# Patient Record
Sex: Male | Born: 1960 | Race: White | Hispanic: No
Health system: Midwestern US, Community
[De-identification: ages and names within clinical notes are randomized; demographics above are authoritative.]

## PROBLEM LIST (undated history)

## (undated) DIAGNOSIS — K7581 Nonalcoholic steatohepatitis (NASH): Secondary | ICD-10-CM

## (undated) DIAGNOSIS — E669 Obesity, unspecified: Secondary | ICD-10-CM

## (undated) DIAGNOSIS — K7460 Unspecified cirrhosis of liver: Secondary | ICD-10-CM

## (undated) DIAGNOSIS — K760 Fatty (change of) liver, not elsewhere classified: Secondary | ICD-10-CM

## (undated) DIAGNOSIS — I8510 Secondary esophageal varices without bleeding: Secondary | ICD-10-CM

## (undated) DIAGNOSIS — K766 Portal hypertension: Secondary | ICD-10-CM

## (undated) DIAGNOSIS — K3189 Other diseases of stomach and duodenum: Secondary | ICD-10-CM

## (undated) DIAGNOSIS — I872 Venous insufficiency (chronic) (peripheral): Secondary | ICD-10-CM

## (undated) DIAGNOSIS — D696 Thrombocytopenia, unspecified: Secondary | ICD-10-CM

## (undated) DIAGNOSIS — R030 Elevated blood-pressure reading, without diagnosis of hypertension: Secondary | ICD-10-CM

## (undated) DIAGNOSIS — G4730 Sleep apnea, unspecified: Secondary | ICD-10-CM

## (undated) DIAGNOSIS — J302 Other seasonal allergic rhinitis: Secondary | ICD-10-CM

## (undated) DIAGNOSIS — E785 Hyperlipidemia, unspecified: Secondary | ICD-10-CM

## (undated) DIAGNOSIS — R29810 Facial weakness: Secondary | ICD-10-CM

## (undated) DIAGNOSIS — K219 Gastro-esophageal reflux disease without esophagitis: Secondary | ICD-10-CM

## (undated) HISTORY — DX: Hyperlipidemia, unspecified: E78.5

## (undated) HISTORY — PX: OTHER SURGICAL HISTORY: SHX169

## (undated) HISTORY — DX: Thrombocytopenia, unspecified: D69.6

## (undated) HISTORY — DX: Venous insufficiency (chronic) (peripheral): I87.2

## (undated) HISTORY — DX: Elevated blood-pressure reading, without diagnosis of hypertension: R03.0

## (undated) HISTORY — DX: Other diseases of stomach and duodenum: K31.89

## (undated) HISTORY — DX: Facial weakness: R29.810

## (undated) HISTORY — PX: UMBILICAL HERNIA REPAIR: SHX196

## (undated) HISTORY — PX: HERNIA REPAIR: SHX51

## (undated) HISTORY — DX: Secondary esophageal varices without bleeding: I85.10

## (undated) HISTORY — DX: Nonalcoholic steatohepatitis (NASH): K75.81

## (undated) HISTORY — DX: Portal hypertension: K76.6

## (undated) HISTORY — DX: Secondary esophageal varices without bleeding: K74.60

## (undated) HISTORY — PX: ESOPHAGEAL VARICE LIGATION: SHX625

## (undated) HISTORY — DX: Obesity, unspecified: E66.9

## (undated) SURGERY — ESOPHAGOSCOPY, WITH ESOPHAGEAL VARICES BAND LIGATION
Anesthesia: Moderate Sedation

---

## 1984-03-22 DIAGNOSIS — R29810 Facial weakness: Secondary | ICD-10-CM

## 1984-03-22 HISTORY — PX: OTHER SURGICAL HISTORY: SHX169

## 1984-03-22 HISTORY — DX: Facial weakness: R29.810

## 1986-03-22 HISTORY — PX: VASECTOMY: SHX75

## 2001-06-17 ENCOUNTER — Emergency Department (HOSPITAL_COMMUNITY): Admission: EM | Admit: 2001-06-17 | Discharge: 2001-06-17 | Payer: Self-pay | Admitting: Emergency Medicine

## 2003-10-31 ENCOUNTER — Encounter: Admission: RE | Admit: 2003-10-31 | Discharge: 2003-11-26 | Payer: Self-pay | Admitting: Family Medicine

## 2010-03-18 ENCOUNTER — Ambulatory Visit
Admission: RE | Admit: 2010-03-18 | Discharge: 2010-03-18 | Payer: Self-pay | Source: Home / Self Care | Attending: Family Medicine | Admitting: Family Medicine

## 2010-03-18 DIAGNOSIS — E785 Hyperlipidemia, unspecified: Secondary | ICD-10-CM | POA: Insufficient documentation

## 2010-03-18 DIAGNOSIS — D696 Thrombocytopenia, unspecified: Secondary | ICD-10-CM | POA: Insufficient documentation

## 2010-03-18 DIAGNOSIS — R030 Elevated blood-pressure reading, without diagnosis of hypertension: Secondary | ICD-10-CM

## 2010-03-18 DIAGNOSIS — E1165 Type 2 diabetes mellitus with hyperglycemia: Secondary | ICD-10-CM

## 2010-03-18 DIAGNOSIS — I10 Essential (primary) hypertension: Secondary | ICD-10-CM

## 2010-03-18 HISTORY — DX: Elevated blood-pressure reading, without diagnosis of hypertension: R03.0

## 2010-03-18 LAB — CONVERTED CEMR LAB
ALT: 68 units/L — ABNORMAL HIGH (ref 0–53)
AST: 56 units/L — ABNORMAL HIGH (ref 0–37)
Albumin: 3.8 g/dL (ref 3.5–5.2)
Alkaline Phosphatase: 64 units/L (ref 39–117)
Basophils Absolute: 0 10*3/uL (ref 0.0–0.1)
Basophils Relative: 0.4 % (ref 0.0–3.0)
Calcium: 8.8 mg/dL (ref 8.4–10.5)
Eosinophils Relative: 3.3 % (ref 0.0–5.0)
GFR calc non Af Amer: 115.24 mL/min (ref >60.00)
HCT: 46.6 % (ref 39.0–52.0)
HDL: 38.2 mg/dL — ABNORMAL LOW (ref >39.00)
Hemoglobin: 16.1 g/dL (ref 13.0–17.0)
Lymphocytes Relative: 22.6 % (ref 12.0–46.0)
Monocytes Relative: 9.5 % (ref 3.0–12.0)
Neutro Abs: 2.7 10*3/uL (ref 1.4–7.7)
Potassium: 3.8 meq/L (ref 3.5–5.1)
RBC: 4.71 M/uL (ref 4.22–5.81)
Sodium: 137 meq/L (ref 135–145)
Total CHOL/HDL Ratio: 5
VLDL: 20 mg/dL (ref 0.0–40.0)
WBC: 4.3 10*3/uL — ABNORMAL LOW (ref 4.5–10.5)

## 2010-03-19 ENCOUNTER — Telehealth: Payer: Self-pay | Admitting: Family Medicine

## 2010-03-19 ENCOUNTER — Telehealth (INDEPENDENT_AMBULATORY_CARE_PROVIDER_SITE_OTHER): Payer: Self-pay | Admitting: *Deleted

## 2010-03-19 ENCOUNTER — Encounter: Payer: Self-pay | Admitting: Family Medicine

## 2010-03-19 DIAGNOSIS — R740 Nonspecific elevation of levels of transaminase and lactic acid dehydrogenase [LDH]: Secondary | ICD-10-CM

## 2010-03-19 DIAGNOSIS — R17 Unspecified jaundice: Secondary | ICD-10-CM | POA: Insufficient documentation

## 2010-03-19 LAB — CONVERTED CEMR LAB: LDH: 240 units/L (ref 94–250)

## 2010-03-20 ENCOUNTER — Encounter: Payer: Self-pay | Admitting: Family Medicine

## 2010-03-22 DIAGNOSIS — K7581 Nonalcoholic steatohepatitis (NASH): Secondary | ICD-10-CM

## 2010-03-22 DIAGNOSIS — D696 Thrombocytopenia, unspecified: Secondary | ICD-10-CM

## 2010-03-22 DIAGNOSIS — E785 Hyperlipidemia, unspecified: Secondary | ICD-10-CM

## 2010-03-22 HISTORY — DX: Nonalcoholic steatohepatitis (NASH): K75.81

## 2010-03-22 HISTORY — DX: Hyperlipidemia, unspecified: E78.5

## 2010-03-22 HISTORY — DX: Thrombocytopenia, unspecified: D69.6

## 2010-04-20 ENCOUNTER — Ambulatory Visit
Admission: RE | Admit: 2010-04-20 | Discharge: 2010-04-20 | Payer: Self-pay | Source: Home / Self Care | Attending: Family Medicine | Admitting: Family Medicine

## 2010-04-20 ENCOUNTER — Encounter
Admission: RE | Admit: 2010-04-20 | Discharge: 2010-04-20 | Payer: Self-pay | Source: Home / Self Care | Attending: Family Medicine | Admitting: Family Medicine

## 2010-04-20 ENCOUNTER — Other Ambulatory Visit: Payer: Self-pay | Admitting: Family Medicine

## 2010-04-20 ENCOUNTER — Encounter: Payer: Self-pay | Admitting: Family Medicine

## 2010-04-20 DIAGNOSIS — K7689 Other specified diseases of liver: Secondary | ICD-10-CM | POA: Insufficient documentation

## 2010-04-20 DIAGNOSIS — L909 Atrophic disorder of skin, unspecified: Secondary | ICD-10-CM | POA: Insufficient documentation

## 2010-04-20 DIAGNOSIS — L919 Hypertrophic disorder of the skin, unspecified: Secondary | ICD-10-CM

## 2010-04-21 ENCOUNTER — Telehealth: Payer: Self-pay | Admitting: Family Medicine

## 2010-04-23 NOTE — Miscellaneous (Signed)
  Clinical Lists Changes  Medications: Added new medication of WELCHOL 625 MG TABS (COLESEVELAM HCL) 3 tabs two times a day - Signed Rx of WELCHOL 625 MG TABS (COLESEVELAM HCL) 3 tabs two times a day;  #180 x 1;  Signed;  Entered by: Kelle Darting M.D.;  Authorized by: Kelle Darting M.D.;  Method used: Printed then faxed to [facility] [phone]*, [address], [place], [place], [place]  [address], Ph: [phone] or [phone], Fax: [phone]    Prescriptions: WELCHOL 625 MG TABS (COLESEVELAM HCL) 3 tabs two times a day  #180 x 1   Entered and Authorized by:   Kelle Darting M.D.   Signed by:   Kelle Darting M.D. on 03/20/2010   Method used:   Printed then faxed to ...       [phone][facility] (retail)       [place], [place]  [address]       Ph: [phone] or [phone]       Fax: [phone]   RxID:   [id number]
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
## 2010-04-23 NOTE — Progress Notes (Signed)
Summary: Bloodwork results  Phone Note Call from Patient Call back at Work Phone [phone] Call back at [phone]   Caller: Patient Summary of Call: Pt is requesting is copy of bloodwork results to be mailed to his home address Initial call taken by: Titus Dubin,  March 19, 2010 12:05 PM  Follow-up for Phone Call        All labs finalled at this time.  Copies printed and mailed to pt. Follow-up by: Abelino Derrick CMA Deborra Medina),  March 20, 2010 10:41 AM

## 2010-04-23 NOTE — Progress Notes (Signed)
Summary: lab results  Phone Note Other Incoming   Summary of Call: Please notify: his labs showed that his bad cholesterol is a bit too high (116, and his goal is <100), and his good cholesterol was a bit low.   I'll start him on a med for cholesterol treatment, plus he needs to work on a diabetic diet--with extra focus on low fat/low cholesterol as well (will print out rx for welchol to fax to pharmacy of his choice--ask him for this info). His HbA1c was 7.4%, which is not too bad but his goal is 6.5% or less.  He was started on metformin yesterday for this, plus needs to get with nutritionist about diabetic diet (he is looking into this through his employer).  Additionally, his liver tests were slightly up and his platelets were a bit low.  If he can get me any old records about his previous liver work-up (in the 1980s?) that would be helpful, but I also recommend that he get an abdominal ultrasound to further assess his liver, pancreas, and spleen.  We'll have to coordinate this testing with a visit back to [place], b/c he is living in [place], [place] currently.  He should be coming in for f/u with me in a month, so we should try to schedule it for that time, and we'll repeat his liver tests and platelets (and others) at that time.  Thanks. Initial call taken by: Kelle Darting M.D.,  March 19, 2010 4:03 PM  Follow-up for Phone Call        [facility] from pt.  He is agreeable with all of the above.  ROA scheudled for 04/20/10.  Ultrasound appt also on 04/20/10.  RX mailed to pt at [address]. [place], [place] [address].  Orders entered into EMR.  Pt has spoken to nutritionist through work adn has a follow up with her.  pt will continue metformin. Follow-up by: Abelino Derrick CMA Deborra Medina),  March 25, 2010 4:30 PM  New Problems: NONSPEC ELEVATION OF LEVELS OF TRANSAMINASE/LDH (ICD-790.4)   New Problems: NONSPEC ELEVATION OF LEVELS OF TRANSAMINASE/LDH (ICD-790.4)

## 2010-04-23 NOTE — Progress Notes (Signed)
Summary: Lab add-on request  Phone Note Other Incoming   Summary of Call: Please add HbA1c (Dx is 250.02), lipase, , LDH, and PT/INR to his blood work from today (new dx is hyperbilirubinemia).  Follow-up for Phone Call        Add-on labs requested, hyperbilirubinemia added to problem list Follow-up by: Abelino Derrick CMA Deborra Medina),  March 19, 2010 1:51 PM  New Problems: HYPERBILIRUBINEMIA (ICD-782.4)   New Problems: HYPERBILIRUBINEMIA (ICD-782.4)  Appended Document: Lab add-on request Per Santiago Glad @ [facility], unable to do PT/INR as they did not draw blue top tube.  Per Dr. Anitra Lauth OK to cancel PT/INR.
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
## 2010-04-29 NOTE — Progress Notes (Signed)
  Phone Note Call from Patient Call back at [phone]   Caller: Patient Call For: Dr. Anitra Lauth Summary of Call: Patient returning call please call [phone]. Initial call taken by: Bud Face,  April 21, 2010 11:36 AM  Follow-up for Phone Call        Notified pt of gallstones w/out cholecystitis on u/s he had yesterday b/c I forgot to mention it to him when he was here for o/v yesterday. Follow-up by: Kelle Darting M.D.,  April 21, 2010 12:01 PM
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
## 2010-04-29 NOTE — Letter (Signed)
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     Sincerely,  Dr. Shawnie Dapper  typed by: Bud Face

## 2010-05-28 ENCOUNTER — Encounter: Payer: Self-pay | Admitting: *Deleted

## 2010-06-15 ENCOUNTER — Ambulatory Visit: Payer: Self-pay | Admitting: Family Medicine

## 2010-06-15 ENCOUNTER — Ambulatory Visit (INDEPENDENT_AMBULATORY_CARE_PROVIDER_SITE_OTHER): Payer: BC Managed Care – PPO | Admitting: Family Medicine

## 2010-06-15 ENCOUNTER — Other Ambulatory Visit: Payer: Self-pay | Admitting: Family Medicine

## 2010-06-15 ENCOUNTER — Encounter: Payer: Self-pay | Admitting: Family Medicine

## 2010-06-15 VITALS — BP 140/91 | HR 77 | Temp 98.0°F | Ht 71.0 in | Wt 283.8 lb

## 2010-06-15 DIAGNOSIS — E785 Hyperlipidemia, unspecified: Secondary | ICD-10-CM

## 2010-06-15 DIAGNOSIS — E119 Type 2 diabetes mellitus without complications: Secondary | ICD-10-CM

## 2010-06-15 DIAGNOSIS — IMO0001 Reserved for inherently not codable concepts without codable children: Secondary | ICD-10-CM

## 2010-06-15 DIAGNOSIS — K7581 Nonalcoholic steatohepatitis (NASH): Secondary | ICD-10-CM

## 2010-06-15 DIAGNOSIS — K7689 Other specified diseases of liver: Secondary | ICD-10-CM

## 2010-06-15 DIAGNOSIS — I872 Venous insufficiency (chronic) (peripheral): Secondary | ICD-10-CM

## 2010-06-15 DIAGNOSIS — D696 Thrombocytopenia, unspecified: Secondary | ICD-10-CM

## 2010-06-15 LAB — CBC WITH DIFFERENTIAL/PLATELET
Basophils Relative: 0.4 % (ref 0.0–3.0)
Eosinophils Absolute: 0.1 10*3/uL (ref 0.0–0.7)
Hemoglobin: 15.5 g/dL (ref 13.0–17.0)
MCHC: 34.4 g/dL (ref 30.0–36.0)
MCV: 99.2 fl (ref 78.0–100.0)
Monocytes Absolute: 0.3 10*3/uL (ref 0.1–1.0)
Neutro Abs: 2.8 10*3/uL (ref 1.4–7.7)
RBC: 4.55 Mil/uL (ref 4.22–5.81)

## 2010-06-15 LAB — COMPREHENSIVE METABOLIC PANEL
AST: 44 U/L — ABNORMAL HIGH (ref 0–37)
Albumin: 3.9 g/dL (ref 3.5–5.2)
Alkaline Phosphatase: 79 U/L (ref 39–117)
Chloride: 102 mEq/L (ref 96–112)
Glucose, Bld: 170 mg/dL — ABNORMAL HIGH (ref 70–99)
Potassium: 3.8 mEq/L (ref 3.5–5.1)
Sodium: 138 mEq/L (ref 135–145)
Total Protein: 7 g/dL (ref 6.0–8.3)

## 2010-06-15 LAB — MICROALBUMIN / CREATININE URINE RATIO: Microalb, Ur: 3.7 mg/dL — ABNORMAL HIGH (ref 0.0–1.9)

## 2010-06-15 NOTE — Assessment & Plan Note (Signed)
Dietary compliance better. Check direct LDL today plus AST, ALT.  (He is not fasting). Continue welchol.

## 2010-06-15 NOTE — Patient Instructions (Signed)
Venous Stasis & Chronic Venous Insufficiency As people age, the veins located in their legs may weaken and stretch. When veins weaken and lose the ability to pump blood effectively, the condition is called chronic venous insufficiency (CVI) or venous stasis.  Almost all veins return blood back to the heart. This happens by:  The force of the heart pumping fresh blood pushes blood back to the heart.   Blood flowing to the heart from the force of gravity.  In the deep veins of the legs, blood has to fight gravity and flow upstream back to the heart. Here, the leg muscles contract to pump blood back toward the heart.  Vein walls are elastic, and many veins have small valves that only allow blood to flow in one direction. When leg muscles contract, they push inward against the elastic vein walls. This squeezes blood upward, opens the valves, and moves blood toward the heart. When leg muscles relax, the vein wall also relaxes and the valves inside the vein close to prevent blood from flowing backward. This method of pumping blood out of the legs is called the venous pump. CAUSES The venous pump works best while walking and leg muscles are contracting. But when a person sits or stands, blood pressure in leg veins can build. Deep veins are usually able to withstand short periods of inactivity, but long periods of inactivity (and increased pressure) can stretch, weaken, and damage vein walls.  High blood pressure can also stretch and damage vein walls. The veins may no longer be able to pump blood back to the heart. Venous hypertension (high blood pressure inside veins) that lasts over time is a primary cause of CVI. CVI can also be caused by:   Deep vein thrombosis, a condition where a thrombus (blood clot) blocks blood flow in a vein.   Phlebitis, an inflammation of a superficial vein that causes a blood clot to form.  Other risk factors for CVI may include:   Heredity.   Obesity.   Pregnancy.    Sedentary lifestyle.   Smoking.   Jobs requiring long periods of standing or sitting in one place.   Age and gender:   Women in their 44's and 50's and men in their 62's are more prone to developing CVI.  SYMPTOMS Symptoms of CVI may include:   Varicose veins.   Ulceration or skin breakdown.   Lipodermatosclerosis, a condition that affects the skin just above the ankle, usually on the inside surface. Over time the skin becomes brown, smooth, tight and often painful. Those with this condition have a high risk of developing skin ulcers.   Reddened or discolored skin on the leg.   Swelling.  DIAGNOSIS Your caregiver can diagnose CVI after performing a careful medical history and physical examination. To confirm the diagnosis, the following tests may also be ordered:   Duplex ultrasound.   Plethysmography (tests blood flow).   Venograms (x-ray using a special dye).  TREATMENT The goals of treatment for CVI are to restore a person to an active life and to minimize pain or disability. Typically, CVI does not pose a serious threat to life or limb, and with proper treatment most people with this condition can continue to lead active lives. In most cases, mild CVI can be treated on an outpatient basis with simple procedures. Treatment methods include:   Elastic compression socks.   Sclerotherapy, a procedure involving an injection of a material that "dissolves" the damaged veins. Other veins in the network  of blood vessels take over the function of the damaged veins.   Vein stripping (an older procedure less commonly used).   Laser Ablation surgery.   Valve repair.  HOME CARE INSTRUCTIONS  Elastic compression socks must be worn every day. They can help with symptoms and lower the chances of the problem getting worse, but they do not cure the problem.   Only take over-the-counter or prescription medicines for pain, discomfort, or fever as directed by your caregiver.   Your  caregiver will review your other medications with you.  SEEK MEDICAL CARE IF:  You are confused about how to take your medications.   There is redness, swelling, or increasing pain in the affected area.   There is a red streak or line that extends up or down from the affected area.   There is a breakdown or loss of skin in the affected area, even if the breakdown is small.   You develop an unexplained oral temperature above 101.   There is an injury to the affected area.  SEEK IMMEDIATE MEDICAL CARE IF:  There is an injury and open wound to the affected area.   Pain is not adequately relieved with pain medication prescribed or becomes severe.   An oral temperature above 101 develops.   The foot/ankle below the affected area becomes suddenly numb or the area feels weak and hard to move.  MAKE SURE YOU:   Understand these instructions.   Will watch your condition.   Will get help right away if you are not doing well or get worse.  Document Released: 07/12/2006 Document Re-Released: 02/19/2008 [facility] Patient Information 2011 [place].

## 2010-06-15 NOTE — Assessment & Plan Note (Signed)
Likely secondary to splenic sequestration/portal congestion. No excess bruising, no sign of bleeding.  We'll check CBC today.

## 2010-06-15 NOTE — Assessment & Plan Note (Addendum)
Marginal compliance with monitoring.  Encouraged daily fasting AM check of glucose. Dietary compliance improved, but still sedentary.  Encouraged increased activity. Check HbA1c today, also send urine for microalb/cr. Continue current med.  F/u 45m

## 2010-06-15 NOTE — Assessment & Plan Note (Signed)
Start low Na diet, elevate legs prn, went over s/s of infection.

## 2010-06-15 NOTE — Assessment & Plan Note (Signed)
Will monitor hepatic panel.  Again, patient NOT INTERESTED in liver biopsy. Will seek to minimize future liver insult.  Check anti Hep C ab and Hep Bs ag today.
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
## 2010-06-16 ENCOUNTER — Ambulatory Visit: Payer: Self-pay | Admitting: Family Medicine

## 2010-06-16 ENCOUNTER — Telehealth: Payer: Self-pay | Admitting: Family Medicine

## 2010-06-16 DIAGNOSIS — E785 Hyperlipidemia, unspecified: Secondary | ICD-10-CM

## 2010-06-16 NOTE — Telephone Encounter (Signed)
Pls notify: labs all came back good.  HbA1c down 1.1 % into his goal range.  LDL now at goal (less than 100), and his platelets and liver tests are stable.  His Hep B and C testing was negative.  Continue all current meds.

## 2010-06-18 ENCOUNTER — Telehealth: Payer: Self-pay | Admitting: Family Medicine

## 2010-06-18 NOTE — Telephone Encounter (Signed)
Pt informed and labs sent to pt

## 2010-06-18 NOTE — Telephone Encounter (Signed)
I ordered a HepBs Ag with his recent labs 06/15/10 and I have no result (all other results are in).  Can you see if this was sent out or what the problem is?  Thanks--PM

## 2010-06-18 NOTE — Telephone Encounter (Signed)
Add on has been done should be a 1 day turn around time

## 2010-07-22 ENCOUNTER — Other Ambulatory Visit: Payer: Self-pay | Admitting: Family Medicine

## 2010-07-22 MED ORDER — COLESEVELAM HCL 625 MG PO TABS: 1875.0000 mg | ORAL_TABLET | Freq: Two times a day (BID) | ORAL | Status: DC

## 2010-07-22 NOTE — Telephone Encounter (Signed)
I printed this rx.  Please send to the number Lorriane Shire put in her note.  Thx.--PM

## 2010-07-22 NOTE — Telephone Encounter (Signed)
Please send to the [facility] [place] ,[place] #[phone]. Call patient when it is done.

## 2010-07-22 NOTE — Progress Notes (Signed)
A user error has taken place: encounter opened in error, closed for administrative reasons.

## 2010-07-22 NOTE — Telephone Encounter (Signed)
Pt notified rx will be faxed.  RX faxed to [phone].

## 2010-09-25 ENCOUNTER — Encounter: Payer: Self-pay | Admitting: Family Medicine

## 2010-09-25 ENCOUNTER — Ambulatory Visit (INDEPENDENT_AMBULATORY_CARE_PROVIDER_SITE_OTHER): Payer: BC Managed Care – PPO | Admitting: Family Medicine

## 2010-09-25 DIAGNOSIS — K7689 Other specified diseases of liver: Secondary | ICD-10-CM

## 2010-09-25 DIAGNOSIS — D696 Thrombocytopenia, unspecified: Secondary | ICD-10-CM

## 2010-09-25 DIAGNOSIS — K7581 Nonalcoholic steatohepatitis (NASH): Secondary | ICD-10-CM

## 2010-09-25 DIAGNOSIS — E119 Type 2 diabetes mellitus without complications: Secondary | ICD-10-CM

## 2010-09-25 DIAGNOSIS — E785 Hyperlipidemia, unspecified: Secondary | ICD-10-CM

## 2010-09-25 MED ORDER — METFORMIN HCL 500 MG PO TABS: 500.0000 mg | ORAL_TABLET | Freq: Two times a day (BID) | ORAL | Status: DC

## 2010-09-25 NOTE — Assessment & Plan Note (Addendum)
Noncompliant w/ monitoring. Cont. Current meds for now. Check HbA1c today.

## 2010-09-25 NOTE — Assessment & Plan Note (Signed)
Monitor LFTs and platelets today.

## 2010-09-25 NOTE — Assessment & Plan Note (Signed)
Fasting today: will check usual lipid panel and since he's been fairly close to goals last check we'll check NMR lipoprofile with this today.

## 2010-09-25 NOTE — Progress Notes (Signed)
OFFICE NOTE  09/25/2010  CC:  Chief Complaint  Patient presents with  . Diabetes     HPI:   Patient is a 50 y.o. Caucasian male who is here for f/u NASH and DM 2.   He will be relocating permanently to [place], [place] and today's visit is his last scheduled one before finding an MD down there.  Not compliant with glucose monitoring: has checked it about 6 times in the last 31mo random times, gets 130-220. Diet not really diabetic.  No regular exercise.  Works almost constantly. Denies problems with meds.  No GI sx's, no CP, no SOB, no cough.  Pertinent PMH:  NASH--w/ hx of elevated transaminases and low platelets. Hyperlipidemia DM 2, non insulin-requiring  MEDS;   Outpatient Prescriptions Prior to Visit  Medication Sig Dispense Refill  . colesevelam (WELCHOL) 625 MG tablet Take 3 tablets (1,875 mg total) by mouth 2 (two) times daily with a meal.  180 tablet  6  . multivitamin (THERAGRAN) per tablet Take 1 tablet by mouth daily.        . metFORMIN (GLUCOPHAGE) 500 MG tablet Take 500 mg by mouth 2 (two) times daily.        . traMADol (ULTRAM) 50 MG tablet Take 50 mg by mouth every 4 (four) hours as needed.          PE: Blood pressure 137/92, pulse 82, height 5' 11"  (1.803 m), weight 278 lb (126.1 kg). Gen: Alert, well appearing.  Patient is oriented to person, place, time, and situation. Chest: symmetric expansion, nonlabored respirations.  Clear and equal breath sounds in all lung fields.   CV: RRR, no m/r/g.  Peripheral pulses 2+ and symmetric. ABD: soft, NT, ND, BS normal.  No hepatospenomegaly or mass.  No bruits. EXT: no clubbing or cyanosis.  Trace bilat anterior tibial edema. Feet: some hypertrophic nails, without callus or skin breakdown.  Sensation intact in all areas to monofilament testing bilat. DP and PT pulses 2+ bilat.  IMPRESSION AND PLAN:  DIAB W/O MENTION COMP TYPE II/UNS TYPE UNCNTRL Noncompliant w/ monitoring. Cont. Current meds for now. Check HbA1c  today.  HYPERLIPIDEMIA Fasting today: will check usual lipid panel and since he's been fairly close to goals last check we'll check NMR lipoprofile with this today.  OTHER CHRONIC NONALCOHOLIC LIVER DISEASE Monitor LFTs and platelets today.     FOLLOW UP:  Return if symptoms worsen or fail to improve.  [place]

## 2010-09-26 LAB — COMPREHENSIVE METABOLIC PANEL
ALT: 51 U/L (ref 0–53)
Albumin: 4.2 g/dL (ref 3.5–5.2)
BUN: 11 mg/dL (ref 6–23)
CO2: 21 mEq/L (ref 19–32)
Calcium: 8.9 mg/dL (ref 8.4–10.5)
Chloride: 106 mEq/L (ref 96–112)
Creat: 0.8 mg/dL (ref 0.50–1.35)

## 2010-09-26 LAB — CBC WITH DIFFERENTIAL/PLATELET
Eosinophils Relative: 3 % (ref 0–5)
HCT: 46.2 % (ref 39.0–52.0)
Lymphocytes Relative: 24 % (ref 12–46)
Lymphs Abs: 1.1 10*3/uL (ref 0.7–4.0)
MCH: 33.5 pg (ref 26.0–34.0)
MCHC: 34.8 g/dL (ref 30.0–36.0)
MCV: 96 fL (ref 78.0–100.0)
Monocytes Absolute: 0.4 10*3/uL (ref 0.1–1.0)
RBC: 4.81 MIL/uL (ref 4.22–5.81)
WBC: 4.6 10*3/uL (ref 4.0–10.5)

## 2010-09-26 LAB — HEMOGLOBIN A1C: Hgb A1c MFr Bld: 6.8 % — ABNORMAL HIGH (ref <5.7)

## 2010-09-27 LAB — NMR LIPOPROFILE WITH LIPIDS
HDL Particle Number: 30.3 umol/L — ABNORMAL LOW (ref >=30.5)
LDL (calc): 102 mg/dL — ABNORMAL HIGH (ref <100)
LDL Size: 20.9 nm (ref >20.5)
LP-IR Score: 27 (ref <=45)
Small LDL Particle Number: 421 nmol/L (ref <=527)

## 2010-09-29 ENCOUNTER — Telehealth: Payer: Self-pay

## 2010-09-29 ENCOUNTER — Other Ambulatory Visit: Payer: Self-pay

## 2010-09-29 MED ORDER — METFORMIN HCL 850 MG PO TABS: 850.0000 mg | ORAL_TABLET | Freq: Two times a day (BID) | ORAL | Status: DC

## 2010-09-29 NOTE — Telephone Encounter (Signed)
After speaking with Dr Charlett Blake she said to send in 850 mg of Metformin not 875 mg.

## 2010-11-24 ENCOUNTER — Other Ambulatory Visit: Payer: Self-pay | Admitting: *Deleted

## 2010-11-24 MED ORDER — METFORMIN HCL 850 MG PO TABS: 850.0000 mg | ORAL_TABLET | Freq: Two times a day (BID) | ORAL | Status: DC

## 2010-11-24 NOTE — Telephone Encounter (Signed)
PC from [facility] requesting refill on Metformin.  Refill sent for 2 additional months.

## 2010-11-30 ENCOUNTER — Other Ambulatory Visit: Payer: Self-pay | Admitting: Family Medicine

## 2010-11-30 NOTE — Telephone Encounter (Signed)
Please advise 

## 2011-02-06 ENCOUNTER — Other Ambulatory Visit: Payer: Self-pay | Admitting: Family Medicine

## 2011-02-08 NOTE — Telephone Encounter (Signed)
PC to pt.  He has not established care in [facility] as of yet.  He has appt with Dr. Anitra Lauth on 02/12/11.  RX filled for 30 days.

## 2011-02-11 ENCOUNTER — Encounter: Payer: Self-pay | Admitting: Family Medicine

## 2011-02-12 ENCOUNTER — Encounter: Payer: Self-pay | Admitting: Family Medicine

## 2011-02-12 ENCOUNTER — Ambulatory Visit (INDEPENDENT_AMBULATORY_CARE_PROVIDER_SITE_OTHER): Payer: BC Managed Care – PPO | Admitting: Family Medicine

## 2011-02-12 DIAGNOSIS — R030 Elevated blood-pressure reading, without diagnosis of hypertension: Secondary | ICD-10-CM

## 2011-02-12 DIAGNOSIS — I872 Venous insufficiency (chronic) (peripheral): Secondary | ICD-10-CM

## 2011-02-12 DIAGNOSIS — E785 Hyperlipidemia, unspecified: Secondary | ICD-10-CM

## 2011-02-12 DIAGNOSIS — K7581 Nonalcoholic steatohepatitis (NASH): Secondary | ICD-10-CM

## 2011-02-12 DIAGNOSIS — Z1211 Encounter for screening for malignant neoplasm of colon: Secondary | ICD-10-CM

## 2011-02-12 DIAGNOSIS — K7689 Other specified diseases of liver: Secondary | ICD-10-CM

## 2011-02-12 DIAGNOSIS — D696 Thrombocytopenia, unspecified: Secondary | ICD-10-CM

## 2011-02-12 DIAGNOSIS — E119 Type 2 diabetes mellitus without complications: Secondary | ICD-10-CM

## 2011-02-12 LAB — COMPREHENSIVE METABOLIC PANEL
ALT: 57 U/L — ABNORMAL HIGH (ref 0–53)
AST: 51 U/L — ABNORMAL HIGH (ref 0–37)
CO2: 23 mEq/L (ref 19–32)
Calcium: 8.8 mg/dL (ref 8.4–10.5)
Chloride: 108 mEq/L (ref 96–112)
GFR: 105.18 mL/min (ref >60.00)
Potassium: 3.7 mEq/L (ref 3.5–5.1)
Sodium: 141 mEq/L (ref 135–145)
Total Protein: 7.3 g/dL (ref 6.0–8.3)

## 2011-02-12 LAB — CBC WITH DIFFERENTIAL/PLATELET
Basophils Absolute: 0 10*3/uL (ref 0.0–0.1)
Eosinophils Absolute: 0.1 10*3/uL (ref 0.0–0.7)
Hemoglobin: 15 g/dL (ref 13.0–17.0)
Lymphocytes Relative: 23.3 % (ref 12.0–46.0)
Monocytes Relative: 10.3 % (ref 3.0–12.0)
Neutrophils Relative %: 63.4 % (ref 43.0–77.0)
Platelets: 80 10*3/uL — ABNORMAL LOW (ref 150.0–400.0)
RDW: 13.5 % (ref 11.5–14.6)

## 2011-02-12 LAB — HEMOGLOBIN A1C: Hgb A1c MFr Bld: 5.8 % (ref 4.6–6.5)

## 2011-02-12 MED ORDER — METFORMIN HCL 500 MG PO TABS: ORAL_TABLET | ORAL | Status: DC
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
## 2011-02-14 ENCOUNTER — Encounter: Payer: Self-pay | Admitting: Family Medicine

## 2011-02-14 DIAGNOSIS — K7581 Nonalcoholic steatohepatitis (NASH): Secondary | ICD-10-CM | POA: Insufficient documentation

## 2011-02-14 DIAGNOSIS — E119 Type 2 diabetes mellitus without complications: Secondary | ICD-10-CM | POA: Insufficient documentation

## 2011-02-14 NOTE — Assessment & Plan Note (Signed)
Initial bp here on triage bp machine was high.  In the room after rest his bp in right arm was 134/90, left arm 132/84. Continue low Na diet, exercise as much as possible. No meds at this time.

## 2011-02-14 NOTE — Assessment & Plan Note (Addendum)
With u/s evidence of mild/early cirrhosis. Avoid NSAIDs and Tylenol if possible.  Avoid ETOH. Controlling lipids the best we can considering he's in between PMDs and he has chronic LFT elevations. Recheck CMET and PT/INR today.

## 2011-02-14 NOTE — Assessment & Plan Note (Signed)
Due to life circumstances presently (trying to fine new PMD in [place]), he wants to continue to postpone his screening colonoscopy for now. He was agreeable to iFOB testing today.

## 2011-02-14 NOTE — Assessment & Plan Note (Signed)
Continue welchol and low chol/low fat diet and recheck lipid panel today (fasting). However, we know from NMR lipoprofile testing in recent past that he does need a statin added.  It is just a matter of doing this under conditions in which he can have his LFTs monitored closely.  He'll have to re-approach this with his knew PMD--when he finds one--in [place], [place].

## 2011-02-14 NOTE — Assessment & Plan Note (Signed)
Problem stable.  Continue current diet and activity appropriate for this condition.  We have reviewed our general long term plan for this problem and also reviewed symptoms and signs that should prompt the patient to call or return to the office.

## 2011-02-14 NOTE — Assessment & Plan Note (Signed)
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 Recheck platelets today.

## 2011-02-14 NOTE — Assessment & Plan Note (Signed)
Well controlled. I believe the fact that his metformin is leading to some mild hypoglycemia is related to his mild hepatic dysfunction from NASH/cirrhosis. If HbA1c today is <6.8% then will decrease metformin dose.

## 2011-02-15 LAB — LIPID PANEL
LDL Cholesterol: 84 mg/dL (ref 0–99)
Total CHOL/HDL Ratio: 3
VLDL: 17.2 mg/dL (ref 0.0–40.0)

## 2011-02-15 NOTE — Progress Notes (Signed)
Quick Note:  Pls call pt and notify him that his lipid panel was excellent. Continue welchol AND low chol/low fat diet. NO new med recommended at this time. ______

## 2011-02-16 ENCOUNTER — Other Ambulatory Visit: Payer: Self-pay | Admitting: *Deleted

## 2011-02-16 MED ORDER — GLUCOSE BLOOD VI STRP: ORAL_STRIP | Status: DC

## 2011-02-16 NOTE — Telephone Encounter (Signed)
Message copied by Graylon Good on Tue Feb 16, 2011 10:21 AM ------      Message from: Anitra Lauth, [place] [facility]      Created: Mon Feb 15, 2011  4:53 PM       Pls call pt and notify him that his lipid panel was excellent.  Continue welchol AND low chol/low fat diet.  NO new med recommended at this time.

## 2011-02-16 NOTE — Telephone Encounter (Signed)
Pt notified in result note.  Pt request RX for test strips.  This is sent.

## 2011-03-05 ENCOUNTER — Other Ambulatory Visit: Payer: Self-pay | Admitting: Family Medicine

## 2011-03-05 NOTE — Telephone Encounter (Signed)
Last seen 02/12/11.  Pt to return in four months.  Welchol refilled, metformin denied as dose has been decreased.

## 2011-03-31 ENCOUNTER — Other Ambulatory Visit: Payer: Self-pay | Admitting: Family Medicine

## 2011-03-31 ENCOUNTER — Other Ambulatory Visit: Payer: BC Managed Care – PPO

## 2011-03-31 DIAGNOSIS — Z1211 Encounter for screening for malignant neoplasm of colon: Secondary | ICD-10-CM

## 2011-03-31 LAB — FECAL OCCULT BLOOD, IMMUNOCHEMICAL: Fecal Occult Bld: NEGATIVE

## 2011-05-24 ENCOUNTER — Ambulatory Visit (INDEPENDENT_AMBULATORY_CARE_PROVIDER_SITE_OTHER): Payer: BC Managed Care – PPO | Admitting: Family Medicine

## 2011-05-24 ENCOUNTER — Encounter: Payer: Self-pay | Admitting: Family Medicine

## 2011-05-24 DIAGNOSIS — K7581 Nonalcoholic steatohepatitis (NASH): Secondary | ICD-10-CM

## 2011-05-24 DIAGNOSIS — D696 Thrombocytopenia, unspecified: Secondary | ICD-10-CM

## 2011-05-24 DIAGNOSIS — Z1211 Encounter for screening for malignant neoplasm of colon: Secondary | ICD-10-CM

## 2011-05-24 DIAGNOSIS — E119 Type 2 diabetes mellitus without complications: Secondary | ICD-10-CM

## 2011-05-24 DIAGNOSIS — K7689 Other specified diseases of liver: Secondary | ICD-10-CM

## 2011-05-24 DIAGNOSIS — E785 Hyperlipidemia, unspecified: Secondary | ICD-10-CM

## 2011-05-24 DIAGNOSIS — M2550 Pain in unspecified joint: Secondary | ICD-10-CM

## 2011-05-24 LAB — COMPREHENSIVE METABOLIC PANEL
AST: 45 U/L — ABNORMAL HIGH (ref 0–37)
Albumin: 4 g/dL (ref 3.5–5.2)
BUN: 10 mg/dL (ref 6–23)
Calcium: 8.8 mg/dL (ref 8.4–10.5)
Chloride: 108 mEq/L (ref 96–112)
Glucose, Bld: 121 mg/dL — ABNORMAL HIGH (ref 70–99)
Potassium: 3.7 mEq/L (ref 3.5–5.1)

## 2011-05-24 LAB — CBC WITH DIFFERENTIAL/PLATELET
Basophils Relative: 0.6 % (ref 0.0–3.0)
Eosinophils Relative: 2.8 % (ref 0.0–5.0)
Lymphocytes Relative: 23.5 % (ref 12.0–46.0)
Monocytes Relative: 9 % (ref 3.0–12.0)
Neutrophils Relative %: 64.1 % (ref 43.0–77.0)
RBC: 4.58 Mil/uL (ref 4.22–5.81)
WBC: 3.9 10*3/uL — ABNORMAL LOW (ref 4.5–10.5)

## 2011-05-24 LAB — HEMOGLOBIN A1C: Hgb A1c MFr Bld: 6.2 % (ref 4.6–6.5)

## 2011-05-24 MED ORDER — ATORVASTATIN CALCIUM 10 MG PO TABS: 10.0000 mg | ORAL_TABLET | Freq: Every day | ORAL | Status: DC

## 2011-05-28 NOTE — Assessment & Plan Note (Signed)
Continues to put off initial screening colonoscopy due to transition of medical care to [facility].   I encouraged him to continue to seek a PMD there.  An Ifob within the last few months was negative/normal.

## 2011-05-28 NOTE — Assessment & Plan Note (Signed)
No sign of inflammatory arthritis. Hx of borderline vit D per pt. Will recheck vit D level today.

## 2011-05-28 NOTE — Assessment & Plan Note (Signed)
Continue welchol.  Decided to go ahead and start atorvastatin 47m qod for 367mothen recheck lipids and hepatic panel.
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
## 2011-05-28 NOTE — Assessment & Plan Note (Addendum)
With hepatomegaly and splenomegaly (and associated thrombocytopenia): has been stable. Continue to be careful to minimize exposure to liver toxins, continue to monitor LFTS and platelets, and will do this especially closely since we'll gradually be getting him on a statin, starting today.

## 2011-05-28 NOTE — Assessment & Plan Note (Addendum)
Problem stable.  Continue current medications and diet appropriate for this condition.  We have reviewed our general long term plan for this problem and also reviewed symptoms and signs that should prompt the patient to call or return to the office. Eye exam UTD, but soon will be due, and he needs to get annual diab retinpthy screening exam but wants to wait until he gets established in [place].   Due for HbA1c today.  All other diabetic monitoring parameters and appropriate vaccinations are UTD. Encouraged more striingent adherence to diabetic diet and INCREASE EXERCISE!Marland Kitchen

## 2011-06-09 ENCOUNTER — Encounter: Payer: Self-pay | Admitting: Family Medicine

## 2011-07-29 ENCOUNTER — Encounter: Payer: Self-pay | Admitting: Family Medicine

## 2011-07-29 ENCOUNTER — Other Ambulatory Visit: Payer: Self-pay | Admitting: *Deleted

## 2011-07-29 MED ORDER — COLESEVELAM HCL 625 MG PO TABS: 1875.0000 mg | ORAL_TABLET | Freq: Two times a day (BID) | ORAL | Status: DC

## 2011-07-29 NOTE — Telephone Encounter (Signed)
MyChart message received requesting refill for welchol. Last filled by MD on 03/05/11, #180 x 3 Last seen on 05/24/11 Follow up 09/2101 RX sent.

## 2011-09-20 ENCOUNTER — Ambulatory Visit (INDEPENDENT_AMBULATORY_CARE_PROVIDER_SITE_OTHER): Payer: BC Managed Care – PPO | Admitting: Family Medicine

## 2011-09-20 ENCOUNTER — Encounter: Payer: Self-pay | Admitting: Family Medicine

## 2011-09-20 VITALS — BP 131/81 | HR 71 | Ht 71.0 in | Wt 274.0 lb

## 2011-09-20 DIAGNOSIS — K7689 Other specified diseases of liver: Secondary | ICD-10-CM

## 2011-09-20 DIAGNOSIS — K7581 Nonalcoholic steatohepatitis (NASH): Secondary | ICD-10-CM

## 2011-09-20 DIAGNOSIS — IMO0001 Reserved for inherently not codable concepts without codable children: Secondary | ICD-10-CM

## 2011-09-20 DIAGNOSIS — E559 Vitamin D deficiency, unspecified: Secondary | ICD-10-CM

## 2011-09-20 DIAGNOSIS — E785 Hyperlipidemia, unspecified: Secondary | ICD-10-CM

## 2011-09-20 DIAGNOSIS — E119 Type 2 diabetes mellitus without complications: Secondary | ICD-10-CM

## 2011-09-20 DIAGNOSIS — D696 Thrombocytopenia, unspecified: Secondary | ICD-10-CM

## 2011-09-20 LAB — HEMOGLOBIN A1C: Hgb A1c MFr Bld: 6.2 % (ref 4.6–6.5)

## 2011-09-20 LAB — COMPREHENSIVE METABOLIC PANEL
ALT: 43 U/L (ref 0–53)
AST: 44 U/L — ABNORMAL HIGH (ref 0–37)
Albumin: 3.7 g/dL (ref 3.5–5.2)
Alkaline Phosphatase: 62 U/L (ref 39–117)
Calcium: 8.8 mg/dL (ref 8.4–10.5)
Chloride: 104 mEq/L (ref 96–112)
Potassium: 3.7 mEq/L (ref 3.5–5.1)
Sodium: 138 mEq/L (ref 135–145)
Total Protein: 6.9 g/dL (ref 6.0–8.3)

## 2011-09-20 LAB — CBC WITH DIFFERENTIAL/PLATELET
Basophils Absolute: 0 10*3/uL (ref 0.0–0.1)
Eosinophils Relative: 2.9 % (ref 0.0–5.0)
HCT: 39.1 % (ref 39.0–52.0)
Lymphs Abs: 0.8 10*3/uL (ref 0.7–4.0)
Monocytes Absolute: 0.3 10*3/uL (ref 0.1–1.0)
Monocytes Relative: 8.2 % (ref 3.0–12.0)
Neutrophils Relative %: 63 % (ref 43.0–77.0)
Platelets: 79 10*3/uL — ABNORMAL LOW (ref 150.0–400.0)
RDW: 13.3 % (ref 11.5–14.6)
WBC: 3.2 10*3/uL — ABNORMAL LOW (ref 4.5–10.5)

## 2011-09-20 LAB — LIPID PANEL
HDL: 46.7 mg/dL (ref >39.00)
Total CHOL/HDL Ratio: 3
Triglycerides: 95 mg/dL (ref 0.0–149.0)
VLDL: 19 mg/dL (ref 0.0–40.0)

## 2011-09-20 LAB — MICROALBUMIN / CREATININE URINE RATIO
Creatinine,U: 374.7 mg/dL
Microalb Creat Ratio: 0.2 mg/g (ref 0.0–30.0)

## 2011-09-20 MED ORDER — COLESEVELAM HCL 625 MG PO TABS: 1875.0000 mg | ORAL_TABLET | Freq: Two times a day (BID) | ORAL | Status: DC

## 2011-09-20 MED ORDER — METFORMIN HCL 500 MG PO TABS: ORAL_TABLET | ORAL | Status: DC

## 2011-09-20 NOTE — Assessment & Plan Note (Signed)
Tolerating atorvastatin 59m qod fine. Will see if liver tests are stable and see how lipids responded. Adjust dose as appropriate.

## 2011-09-20 NOTE — Assessment & Plan Note (Addendum)
With portal HTN+splenomegaly and thrombocytopenia. Recheck CMET and CBC today. Once he establishes a PCP in [facility] then he can get hooked up to GI MD for colon cancer screening and possibly EGD (to assess for asymptomatic esophageal varices.

## 2011-09-20 NOTE — Assessment & Plan Note (Signed)
Poor control last 3-71mo Noncompliant with diet and exercise. Check HbA1c today as well as urine microalbumin/cr. His eye exam is UTD (done earlier today).
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
## 2011-09-21 ENCOUNTER — Other Ambulatory Visit: Payer: Self-pay | Admitting: Family Medicine

## 2011-09-21 DIAGNOSIS — E559 Vitamin D deficiency, unspecified: Secondary | ICD-10-CM

## 2011-09-21 DIAGNOSIS — K7581 Nonalcoholic steatohepatitis (NASH): Secondary | ICD-10-CM

## 2011-09-21 MED ORDER — ATORVASTATIN CALCIUM 10 MG PO TABS: 10.0000 mg | ORAL_TABLET | ORAL | Status: DC

## 2011-09-21 MED ORDER — METFORMIN HCL 500 MG PO TABS: ORAL_TABLET | ORAL | Status: DC

## 2012-02-16 ENCOUNTER — Ambulatory Visit (INDEPENDENT_AMBULATORY_CARE_PROVIDER_SITE_OTHER): Payer: BC Managed Care – PPO | Admitting: Family Medicine

## 2012-02-16 ENCOUNTER — Encounter: Payer: Self-pay | Admitting: Family Medicine

## 2012-02-16 VITALS — BP 144/90 | HR 65 | Temp 97.8°F | Ht 71.0 in | Wt 273.0 lb

## 2012-02-16 DIAGNOSIS — K7689 Other specified diseases of liver: Secondary | ICD-10-CM

## 2012-02-16 DIAGNOSIS — I10 Essential (primary) hypertension: Secondary | ICD-10-CM

## 2012-02-16 DIAGNOSIS — I872 Venous insufficiency (chronic) (peripheral): Secondary | ICD-10-CM

## 2012-02-16 DIAGNOSIS — K760 Fatty (change of) liver, not elsewhere classified: Secondary | ICD-10-CM

## 2012-02-16 DIAGNOSIS — K7581 Nonalcoholic steatohepatitis (NASH): Secondary | ICD-10-CM

## 2012-02-16 DIAGNOSIS — E785 Hyperlipidemia, unspecified: Secondary | ICD-10-CM

## 2012-02-16 DIAGNOSIS — Z23 Encounter for immunization: Secondary | ICD-10-CM

## 2012-02-16 DIAGNOSIS — E119 Type 2 diabetes mellitus without complications: Secondary | ICD-10-CM

## 2012-02-16 LAB — LIPID PANEL
Cholesterol: 143 mg/dL (ref 0–200)
VLDL: 22 mg/dL (ref 0.0–40.0)

## 2012-02-16 LAB — COMPREHENSIVE METABOLIC PANEL
CO2: 27 mEq/L (ref 19–32)
Calcium: 8.8 mg/dL (ref 8.4–10.5)
GFR: 119.81 mL/min (ref >60.00)
Glucose, Bld: 116 mg/dL — ABNORMAL HIGH (ref 70–99)
Sodium: 137 mEq/L (ref 135–145)
Total Bilirubin: 2.6 mg/dL — ABNORMAL HIGH (ref 0.3–1.2)
Total Protein: 7.3 g/dL (ref 6.0–8.3)

## 2012-02-16 MED ORDER — LISINOPRIL 5 MG PO TABS: 5.0000 mg | ORAL_TABLET | Freq: Every day | ORAL | Status: DC

## 2012-02-16 NOTE — Assessment & Plan Note (Signed)
As per scant home report things are stable. Check HbA1c today. Needs full foot exam next f/u visit. Continue current med and monitoring.

## 2012-02-16 NOTE — Assessment & Plan Note (Signed)
Stable. Needs to elevate legs more often and limit Na more, but he is not likely to do these things due to his schedule/habits.

## 2012-02-16 NOTE — Assessment & Plan Note (Signed)
Start lisinopril 81m once daily. Monitor bp at home as he is able. BP goal is around 130/80 for him. Check lytes/cr today.

## 2012-02-16 NOTE — Assessment & Plan Note (Signed)
Has been on statin but not very compliant with it at all. Recheck FLP today and will decide after this result how much to push this issue with him.

## 2012-02-16 NOTE — Assessment & Plan Note (Signed)
Monitor CMET today. He avoids tylenol and alcohol. Will discuss at next f/u visit the possibility of f/u abd u/s to compare to the one he had in 2012 that showed mild cirrhotic changes and splenomegaly.
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
## 2012-02-18 NOTE — Addendum Note (Signed)
Addended by: Abelino Derrick C on: 02/18/2012 11:14 AM   Modules accepted: Orders

## 2012-02-22 ENCOUNTER — Other Ambulatory Visit: Payer: Self-pay | Admitting: *Deleted

## 2012-02-22 DIAGNOSIS — IMO0001 Reserved for inherently not codable concepts without codable children: Secondary | ICD-10-CM

## 2012-02-22 DIAGNOSIS — I10 Essential (primary) hypertension: Secondary | ICD-10-CM

## 2012-02-22 DIAGNOSIS — E785 Hyperlipidemia, unspecified: Secondary | ICD-10-CM

## 2012-02-22 DIAGNOSIS — K760 Fatty (change of) liver, not elsewhere classified: Secondary | ICD-10-CM

## 2012-02-22 DIAGNOSIS — K7581 Nonalcoholic steatohepatitis (NASH): Secondary | ICD-10-CM

## 2012-02-22 DIAGNOSIS — E559 Vitamin D deficiency, unspecified: Secondary | ICD-10-CM

## 2012-02-22 DIAGNOSIS — E119 Type 2 diabetes mellitus without complications: Secondary | ICD-10-CM

## 2012-02-22 MED ORDER — LISINOPRIL 5 MG PO TABS: 5.0000 mg | ORAL_TABLET | Freq: Every day | ORAL | Status: DC

## 2012-02-22 MED ORDER — METFORMIN HCL 500 MG PO TABS: 500.0000 mg | ORAL_TABLET | Freq: Every day | ORAL | Status: DC

## 2012-02-22 MED ORDER — COLESEVELAM HCL 625 MG PO TABS: 1875.0000 mg | ORAL_TABLET | Freq: Two times a day (BID) | ORAL | Status: DC

## 2012-02-22 NOTE — Telephone Encounter (Signed)
Refill request for METFORMIN, LISINOPRIL, WELCHOL--90 DAY SUPPLY Last seen- 02/16/12 Refill sent per [facility] refill protocol.

## 2012-04-11 ENCOUNTER — Other Ambulatory Visit: Payer: Self-pay | Admitting: *Deleted

## 2012-04-11 DIAGNOSIS — E559 Vitamin D deficiency, unspecified: Secondary | ICD-10-CM

## 2012-04-11 DIAGNOSIS — K7581 Nonalcoholic steatohepatitis (NASH): Secondary | ICD-10-CM

## 2012-04-11 MED ORDER — METFORMIN HCL 500 MG PO TABS: 500.0000 mg | ORAL_TABLET | Freq: Every day | ORAL | Status: DC

## 2012-09-28 ENCOUNTER — Other Ambulatory Visit: Payer: Self-pay

## 2012-10-25 ENCOUNTER — Other Ambulatory Visit: Payer: Self-pay

## 2012-11-22 ENCOUNTER — Ambulatory Visit (INDEPENDENT_AMBULATORY_CARE_PROVIDER_SITE_OTHER): Payer: BC Managed Care – PPO | Admitting: Family Medicine

## 2012-11-22 ENCOUNTER — Encounter: Payer: Self-pay | Admitting: Family Medicine

## 2012-11-22 VITALS — BP 131/87 | HR 82 | Temp 98.4°F | Resp 18 | Ht 71.0 in | Wt 274.0 lb

## 2012-11-22 DIAGNOSIS — K7689 Other specified diseases of liver: Secondary | ICD-10-CM

## 2012-11-22 DIAGNOSIS — K760 Fatty (change of) liver, not elsewhere classified: Secondary | ICD-10-CM

## 2012-11-22 DIAGNOSIS — E559 Vitamin D deficiency, unspecified: Secondary | ICD-10-CM

## 2012-11-22 DIAGNOSIS — K7581 Nonalcoholic steatohepatitis (NASH): Secondary | ICD-10-CM

## 2012-11-22 DIAGNOSIS — IMO0001 Reserved for inherently not codable concepts without codable children: Secondary | ICD-10-CM

## 2012-11-22 DIAGNOSIS — E119 Type 2 diabetes mellitus without complications: Secondary | ICD-10-CM

## 2012-11-22 DIAGNOSIS — I10 Essential (primary) hypertension: Secondary | ICD-10-CM

## 2012-11-22 DIAGNOSIS — E785 Hyperlipidemia, unspecified: Secondary | ICD-10-CM

## 2012-11-22 LAB — LIPID PANEL
Cholesterol: 161 mg/dL (ref 0–200)
HDL: 42.5 mg/dL (ref >39.00)
Total CHOL/HDL Ratio: 4
Triglycerides: 114 mg/dL (ref 0.0–149.0)

## 2012-11-22 LAB — COMPREHENSIVE METABOLIC PANEL
AST: 48 U/L — ABNORMAL HIGH (ref 0–37)
Alkaline Phosphatase: 60 U/L (ref 39–117)
BUN: 8 mg/dL (ref 6–23)
Creatinine, Ser: 0.7 mg/dL (ref 0.4–1.5)
Glucose, Bld: 127 mg/dL — ABNORMAL HIGH (ref 70–99)
Total Bilirubin: 3.5 mg/dL — ABNORMAL HIGH (ref 0.3–1.2)

## 2012-11-22 MED ORDER — COLESEVELAM HCL 625 MG PO TABS: 1875.0000 mg | ORAL_TABLET | Freq: Two times a day (BID) | ORAL | Status: DC

## 2012-11-22 MED ORDER — METFORMIN HCL 500 MG PO TABS: 500.0000 mg | ORAL_TABLET | Freq: Every day | ORAL | Status: DC

## 2012-11-22 MED ORDER — LISINOPRIL 5 MG PO TABS: 5.0000 mg | ORAL_TABLET | Freq: Every day | ORAL | Status: DC
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
## 2012-11-23 LAB — MICROALBUMIN / CREATININE URINE RATIO
Creatinine,U: 361.8 mg/dL
Microalb Creat Ratio: 0.4 mg/g (ref 0.0–30.0)
Microalb, Ur: 1.3 mg/dL (ref 0.0–1.9)

## 2012-12-13 NOTE — Assessment & Plan Note (Addendum)
Check CMET today. He wants to get established with a GI MD in [place] but he is noncommittal about when. He understands he needs initial colon cancer screening PLUS he may need EGD to screen for esophageal varices.

## 2012-12-13 NOTE — Assessment & Plan Note (Signed)
Stable. Monitor lytes/cr today.

## 2012-12-13 NOTE — Assessment & Plan Note (Signed)
Less than ideal compliance with monitoring and diet. Check HbA1c and urine microalb/cr today.

## 2012-12-13 NOTE — Assessment & Plan Note (Signed)
REcheck FLP today, continue welchol.

## 2013-01-25 IMAGING — US US ABDOMEN COMPLETE
1 series · 13 of 25 positions shown · non-contrast
Comparison: None.

CLINICAL DATA: Elevated LFTs, diabetes

ABDOMINAL ULTRASOUND COMPLETE

[Series 1: us abdomen complete · 13 of 85 slices shown]
[im 1/85]
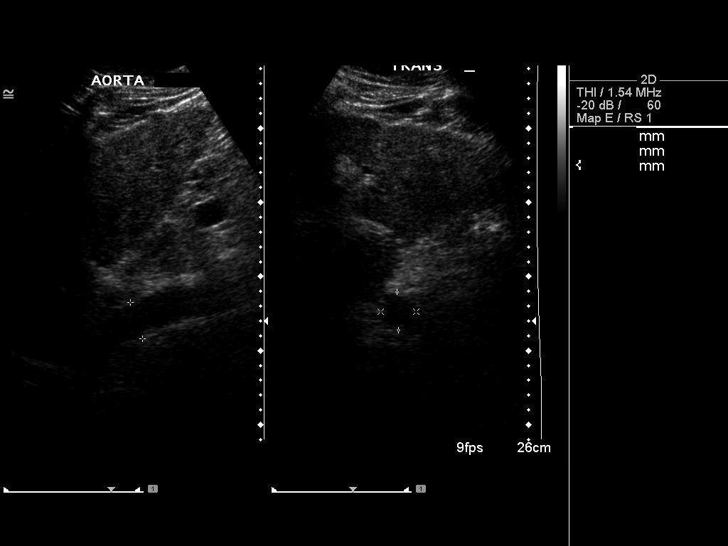
[im 8/85]
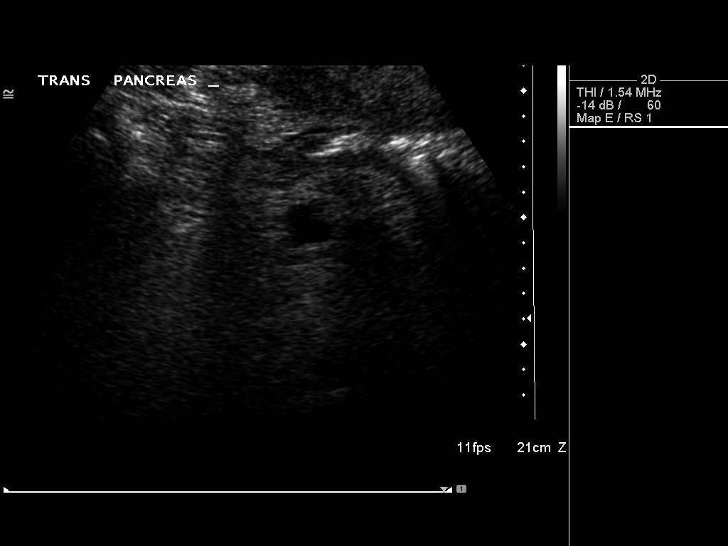
[im 15/85]
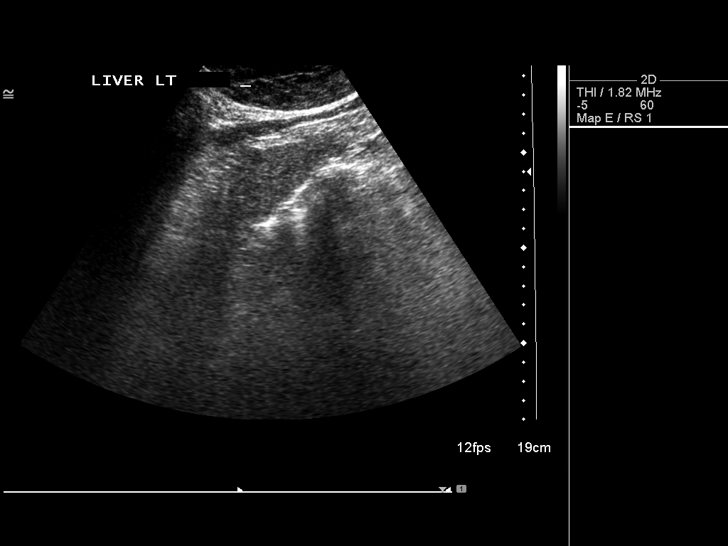
[im 22/85]
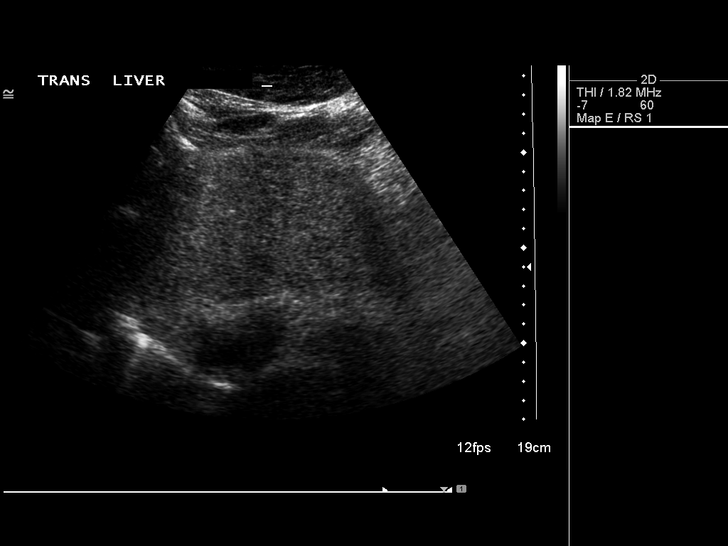
[im 29/85]
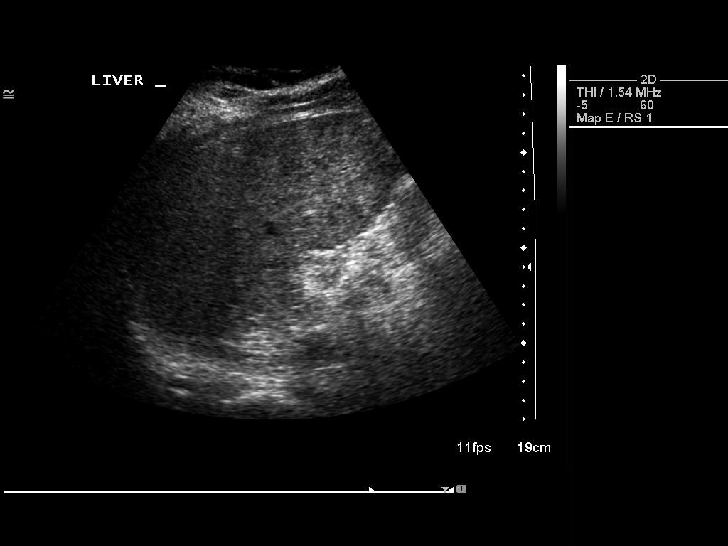
[im 36/85]
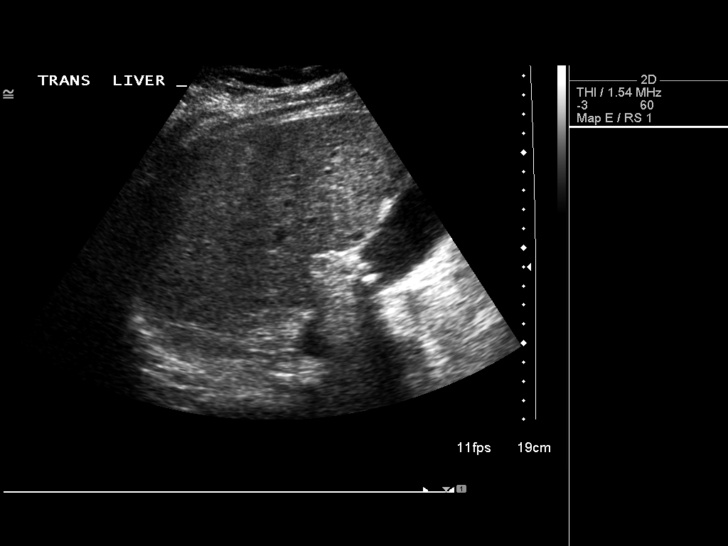
[im 43/85]
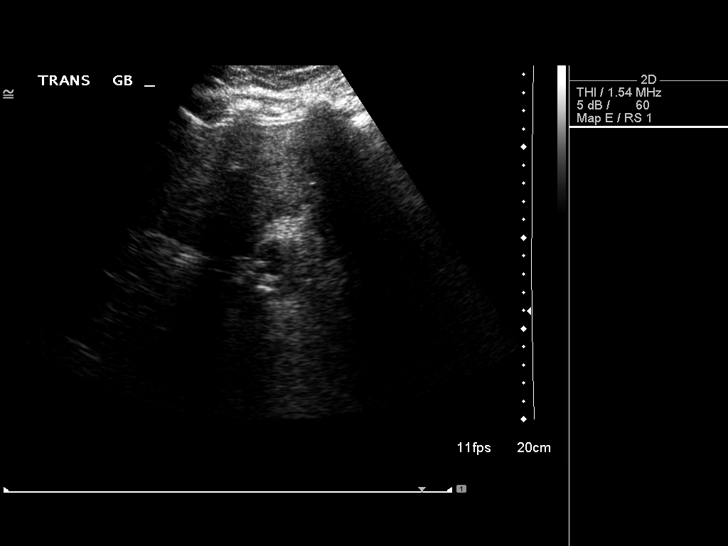
[im 50/85]
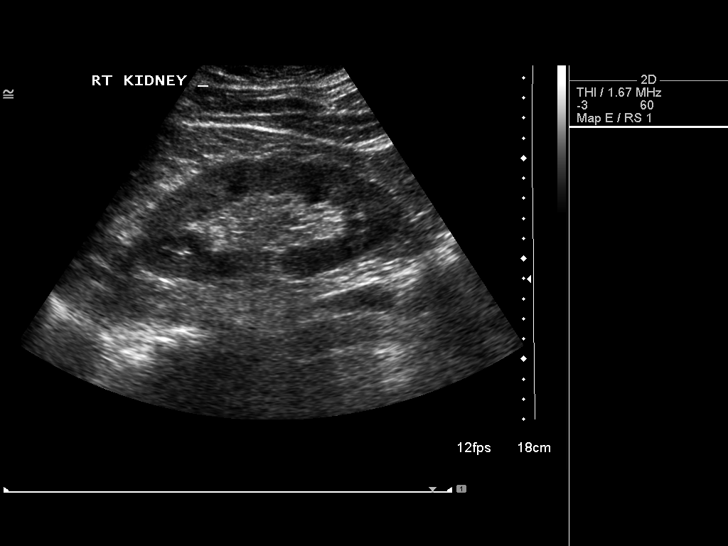
[im 57/85]
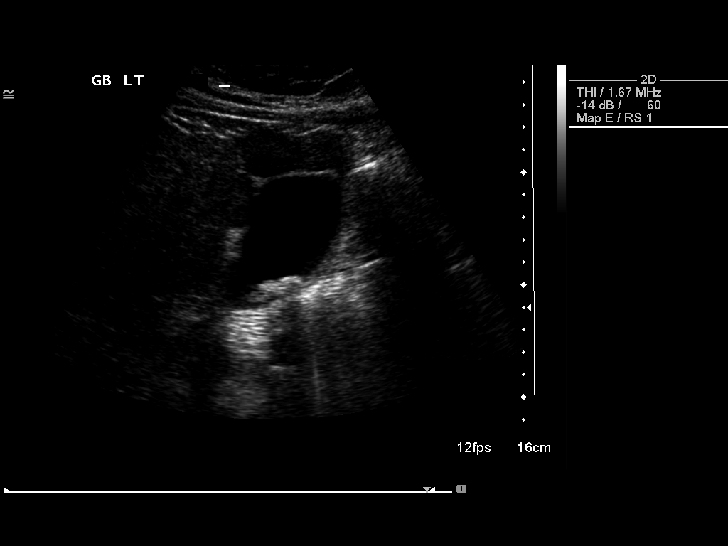
[im 64/85]
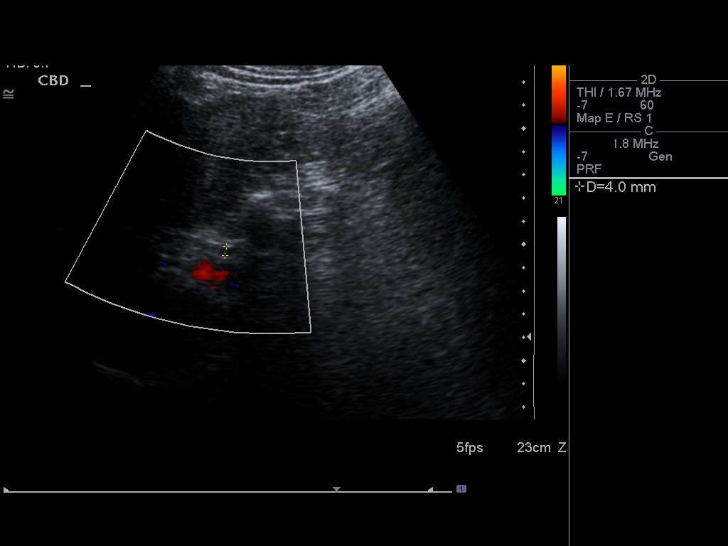
[im 71/85]
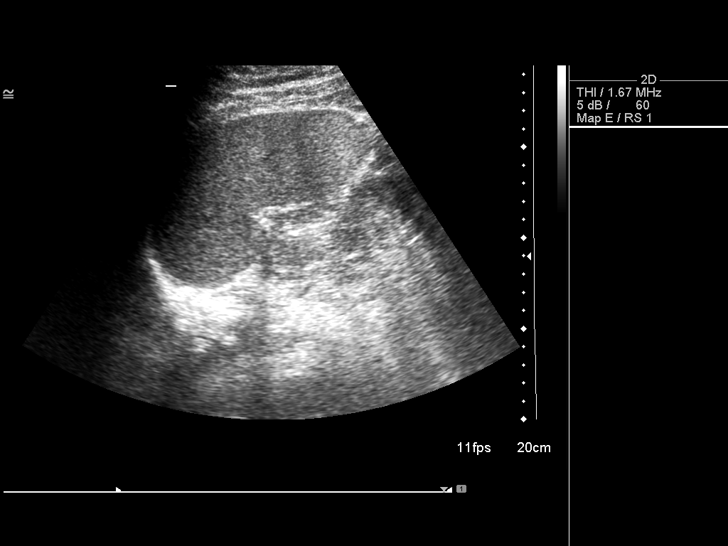
[im 78/85]
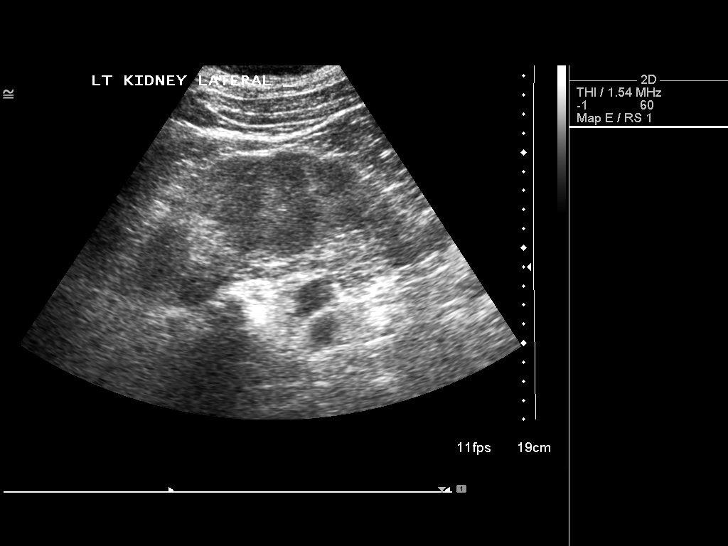
[im 85/85]
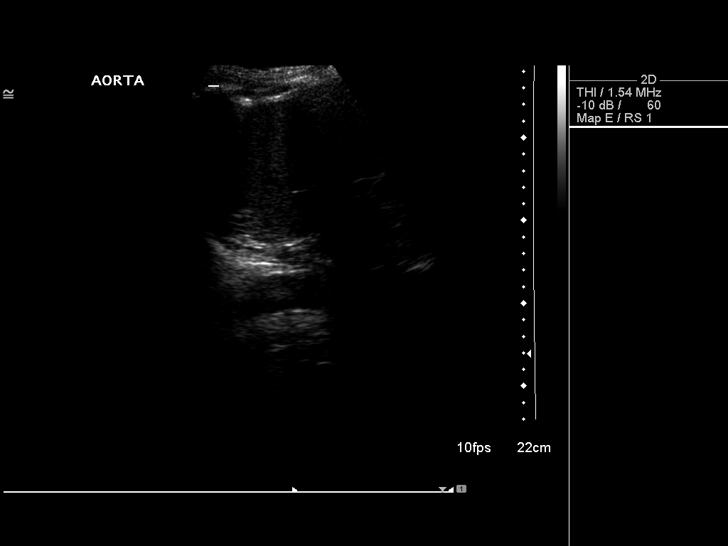

[13 of 25 positions shown; findings below may reference images not displayed]

FINDINGS: Gallbladder:  Numerous small echogenic shadowing gallstones noted,
all 1 cm or less in size.  No associate Murphy's sign or wall
thickening.  Gallbladder wall thickness measures 2.7 mm.

Common Bile Duct:  Within normal limits in caliber.

Liver: Diffuse inhomogeneous appearance with a nodular surface and
slight increased echogenicity.  This suggests background hepatic
steatosis and possibly early cirrhotic changes.  No intrahepatic
biliary dilatation.

IVC:  Appears normal.

Pancreas:  No abnormality identified.

Spleen:  Spleen is enlarged measuring 17 cm in length.  No focal
abnormality demonstrated.

Right kidney:  Normal in size and parenchymal echogenicity.  No
evidence of mass or hydronephrosis.

Left kidney:  Normal in size and parenchymal echogenicity.  No
evidence of mass or hydronephrosis.

Abdominal Aorta:  No aneurysm identified.
IMPRESSION: Cholelithiasis without ultrasound evidence of cholecystitis.

No biliary dilatation

Heterogeneous echogenic liver with a nodular contour, suspect
background hepatic steatosis and possibly early cirrhotic changes.

Splenomegaly

## 2013-03-19 ENCOUNTER — Encounter: Payer: Self-pay | Admitting: Family Medicine

## 2013-03-19 ENCOUNTER — Ambulatory Visit (INDEPENDENT_AMBULATORY_CARE_PROVIDER_SITE_OTHER): Payer: BC Managed Care – PPO | Admitting: Family Medicine

## 2013-03-19 VITALS — BP 135/85 | HR 67 | Temp 98.2°F | Resp 18 | Ht 71.0 in | Wt 272.0 lb

## 2013-03-19 DIAGNOSIS — K7689 Other specified diseases of liver: Secondary | ICD-10-CM

## 2013-03-19 DIAGNOSIS — I10 Essential (primary) hypertension: Secondary | ICD-10-CM

## 2013-03-19 DIAGNOSIS — K7581 Nonalcoholic steatohepatitis (NASH): Secondary | ICD-10-CM

## 2013-03-19 DIAGNOSIS — E785 Hyperlipidemia, unspecified: Secondary | ICD-10-CM

## 2013-03-19 DIAGNOSIS — E119 Type 2 diabetes mellitus without complications: Secondary | ICD-10-CM

## 2013-03-19 MED ORDER — LOSARTAN POTASSIUM 50 MG PO TABS: 50.0000 mg | ORAL_TABLET | Freq: Every day | ORAL | Status: DC

## 2013-03-19 NOTE — Assessment & Plan Note (Signed)
The current medical regimen is effective;  continue present plan and medications. Will recheck HbA1c at next f/u in 4-6 mo.

## 2013-03-19 NOTE — Assessment & Plan Note (Signed)
Lab Results  Component Value Date   CHOL 161 11/22/2012   HDL 42.50 11/22/2012   LDLCALC 96 11/22/2012   LDLDIRECT 97.4 06/15/2010   TRIG 114.0 11/22/2012   CHOLHDL 4 11/22/2012   Continue welchol, repeat FLP at next f/u in 4-6 mo.

## 2013-03-19 NOTE — Assessment & Plan Note (Signed)
Not well controlled; stage 1 systolic. Intolerant of lisinopril (fatigue). Will do trial of losartan 9m qd.
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
## 2013-03-19 NOTE — Progress Notes (Signed)
Pre visit review using our clinic review tool, if applicable. No additional management support is needed unless otherwise documented below in the visit note. 

## 2013-03-19 NOTE — Assessment & Plan Note (Signed)
Labs over the last 18 mo have been stable. Will repeat labs 4-6 mo.  As long as these are stable I feel like repeat abd u/s not indicated. He still needs to get established with a GI MD in [place] but it seems to be low on his list of priorities.

## 2013-06-19 ENCOUNTER — Telehealth: Payer: Self-pay | Admitting: Family Medicine

## 2013-06-19 DIAGNOSIS — E559 Vitamin D deficiency, unspecified: Secondary | ICD-10-CM

## 2013-06-19 DIAGNOSIS — K7581 Nonalcoholic steatohepatitis (NASH): Secondary | ICD-10-CM

## 2013-06-19 DIAGNOSIS — E1165 Type 2 diabetes mellitus with hyperglycemia: Secondary | ICD-10-CM

## 2013-06-19 DIAGNOSIS — IMO0001 Reserved for inherently not codable concepts without codable children: Secondary | ICD-10-CM

## 2013-06-19 MED ORDER — METFORMIN HCL 500 MG PO TABS: 500.0000 mg | ORAL_TABLET | Freq: Every day | ORAL | Status: DC

## 2013-06-19 NOTE — Telephone Encounter (Signed)
Wife says she had set up an appt for 06/21/13, but was then wondering if it was time for Iann to have an appt; checked epic and found last OV; advised by Dr.McGowen to f/u in 4-110mo, which would be between April and June; wife said she would check with husband prior to rescheduling; also requested med refill of Metformin to [facility] on file; please call with any questions

## 2013-06-19 NOTE — Telephone Encounter (Signed)
Metformin sent to [facility].

## 2013-06-21 ENCOUNTER — Encounter: Payer: BC Managed Care – PPO | Admitting: Family Medicine

## 2013-06-21 MED ORDER — METFORMIN HCL 500 MG PO TABS: 500.0000 mg | ORAL_TABLET | Freq: Every day | ORAL | Status: DC

## 2013-06-28 ENCOUNTER — Other Ambulatory Visit: Payer: Self-pay

## 2013-10-08 ENCOUNTER — Encounter: Payer: Self-pay | Admitting: Family Medicine

## 2013-10-08 ENCOUNTER — Ambulatory Visit (INDEPENDENT_AMBULATORY_CARE_PROVIDER_SITE_OTHER): Payer: BC Managed Care – PPO | Admitting: Family Medicine

## 2013-10-08 VITALS — BP 127/85 | HR 77 | Temp 98.5°F | Resp 18 | Ht 71.0 in | Wt 280.0 lb

## 2013-10-08 DIAGNOSIS — I10 Essential (primary) hypertension: Secondary | ICD-10-CM

## 2013-10-08 DIAGNOSIS — E119 Type 2 diabetes mellitus without complications: Secondary | ICD-10-CM

## 2013-10-08 DIAGNOSIS — E785 Hyperlipidemia, unspecified: Secondary | ICD-10-CM

## 2013-10-08 DIAGNOSIS — Z1211 Encounter for screening for malignant neoplasm of colon: Secondary | ICD-10-CM

## 2013-10-08 DIAGNOSIS — K7689 Other specified diseases of liver: Secondary | ICD-10-CM

## 2013-10-08 DIAGNOSIS — K7581 Nonalcoholic steatohepatitis (NASH): Secondary | ICD-10-CM

## 2013-10-08 DIAGNOSIS — D696 Thrombocytopenia, unspecified: Secondary | ICD-10-CM

## 2013-10-08 LAB — CBC WITH DIFFERENTIAL/PLATELET
BASOS ABS: 0 10*3/uL (ref 0.0–0.1)
Basophils Relative: 0.5 % (ref 0.0–3.0)
EOS PCT: 4.6 % (ref 0.0–5.0)
Eosinophils Absolute: 0.2 10*3/uL (ref 0.0–0.7)
HEMATOCRIT: 44.2 % (ref 39.0–52.0)
HEMOGLOBIN: 15.3 g/dL (ref 13.0–17.0)
LYMPHS ABS: 0.8 10*3/uL (ref 0.7–4.0)
Lymphocytes Relative: 23.2 % (ref 12.0–46.0)
MCHC: 34.6 g/dL (ref 30.0–36.0)
MCV: 97.9 fl (ref 78.0–100.0)
MONOS PCT: 10.9 % (ref 3.0–12.0)
Monocytes Absolute: 0.4 10*3/uL (ref 0.1–1.0)
Neutro Abs: 2 10*3/uL (ref 1.4–7.7)
Neutrophils Relative %: 60.8 % (ref 43.0–77.0)
Platelets: 72 10*3/uL — ABNORMAL LOW (ref 150.0–400.0)
RBC: 4.51 Mil/uL (ref 4.22–5.81)
RDW: 13.6 % (ref 11.5–15.5)
WBC: 3.3 10*3/uL — ABNORMAL LOW (ref 4.0–10.5)

## 2013-10-08 LAB — HEMOGLOBIN A1C: Hgb A1c MFr Bld: 8.2 % — ABNORMAL HIGH (ref 4.6–6.5)

## 2013-10-08 NOTE — Progress Notes (Signed)
Pre visit review using our clinic review tool, if applicable. No additional management support is needed unless otherwise documented below in the visit note. 

## 2013-10-08 NOTE — Progress Notes (Signed)
OFFICE NOTE  10/08/2013  CC:  Chief Complaint  Patient presents with  . Follow-up    recheck legs/diabetic   HPI: Patient is a 53 y.o. Caucasian male who is here for 7 mo f/u DM 2. Home gluc's low 100s. Exercise: some kayaking. No home bp's to report.  Feeling well, no acute complaints.  Pertinent PMH:  Past surgical, social, and family history reviewed and no changes noted since last office visit.  MEDS:  Outpatient Prescriptions Prior to Visit  Medication Sig Dispense Refill  . Bioflavonoid Products (BIOFLEX PO) Take 1 tablet by mouth 2 (two) times daily.        . Cholecalciferol (VITAMIN D3) 400 UNITS CAPS Take 1 capsule by mouth daily.      . colesevelam (WELCHOL) 625 MG tablet Take 3 tablets (1,875 mg total) by mouth 2 (two) times daily with a meal.  540 tablet  1  . ibuprofen (ADVIL,MOTRIN) 200 MG tablet Take 200 mg by mouth every 6 (six) hours as needed.      Marland Kitchen losartan (COZAAR) 50 MG tablet Take 1 tablet (50 mg total) by mouth daily.  30 tablet  6  . metFORMIN (GLUCOPHAGE) 500 MG tablet Take 1 tablet (500 mg total) by mouth daily. 1 tab po qd  90 tablet  1  . multivitamin (THERAGRAN) per tablet Take 1 tablet by mouth daily.        Marland Kitchen POTASSIUM GLUCONATE PO Take 1 tablet by mouth daily.      Marland Kitchen atorvastatin (LIPITOR) 10 MG tablet Take 1 tablet (10 mg total) by mouth every other day.  45 tablet  1  . glucose blood (RELION CONFIRM/MICRO TEST) test strip Use as instructed  100 each  3   No facility-administered medications prior to visit.    PE: Blood pressure 127/85, pulse 77, temperature 98.5 F (36.9 C), temperature source Temporal, resp. rate 18, height 5' 11"  (1.803 m), weight 280 lb (127.007 kg), SpO2 94.00%. Gen: Alert, well appearing.  Patient is oriented to person, place, time, and situation. DEY:CXKG: no injection, icteris, swelling, or exudate.  EOMI, PERRLA. Mouth: lips without lesion/swelling.  Oral mucosa pink and moist. Oropharynx without erythema, exudate,  or swelling.  CV: RRR, no m/r/g.   LUNGS: CTA bilat, nonlabored resps, good aeration in all lung fields. EXT: 1-2+ pitting in both LE's, some bronzing skin changes in pretibial regions  IMPRESSION AND PLAN:  Type II or unspecified type diabetes mellitus without mention of complication, not stated as uncontrolled HbA1c today. Eye exam UTD: no diab retpthy (06/2013).  HYPERLIPIDEMIA Hx of noncompliance with statin, and his lipid panel has been acceptable on welchol only in recent past.  Recheck FLP today.  HTN (hypertension), benign The current medical regimen is effective;  continue present plan and medications. Lytes/cr today.  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 Recheck CBC today.  Colon cancer screening Continues to put off initial screening colonoscopy due to transition of medical care to [facility], which is taking way too long. I encouraged him to seek a GI MD there, call me for referral order if needed.  An iFOB 2013 was neg. Will repeat iFOB at next f/u visit.   An After Visit Summary was printed and given to the patient.  FOLLOW UP: 6 mo

## 2013-10-09 LAB — LIPID PANEL
Cholesterol: 168 mg/dL (ref 0–200)
HDL: 47.4 mg/dL (ref >39.00)
LDL Cholesterol: 104 mg/dL — ABNORMAL HIGH (ref 0–99)
NONHDL: 120.6
Total CHOL/HDL Ratio: 4
Triglycerides: 81 mg/dL (ref 0.0–149.0)
VLDL: 16.2 mg/dL (ref 0.0–40.0)

## 2013-10-09 LAB — COMPREHENSIVE METABOLIC PANEL
ALT: 43 U/L (ref 0–53)
AST: 46 U/L — AB (ref 0–37)
Albumin: 3.7 g/dL (ref 3.5–5.2)
Alkaline Phosphatase: 64 U/L (ref 39–117)
BILIRUBIN TOTAL: 3 mg/dL — AB (ref 0.2–1.2)
BUN: 10 mg/dL (ref 6–23)
CO2: 24 mEq/L (ref 19–32)
CREATININE: 0.7 mg/dL (ref 0.4–1.5)
Calcium: 8.8 mg/dL (ref 8.4–10.5)
Chloride: 108 mEq/L (ref 96–112)
GFR: 117.2 mL/min (ref >60.00)
Glucose, Bld: 126 mg/dL — ABNORMAL HIGH (ref 70–99)
Potassium: 3.7 mEq/L (ref 3.5–5.1)
Sodium: 138 mEq/L (ref 135–145)
Total Protein: 6.9 g/dL (ref 6.0–8.3)

## 2013-10-09 MED ORDER — GLIMEPIRIDE 2 MG PO TABS: 2.0000 mg | ORAL_TABLET | Freq: Every day | ORAL | Status: DC

## 2013-10-13 NOTE — Assessment & Plan Note (Signed)
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 Recheck CBC today.

## 2013-10-13 NOTE — Assessment & Plan Note (Signed)
The current medical regimen is effective;  continue present plan and medications. Lytes/cr today. 

## 2013-10-13 NOTE — Assessment & Plan Note (Signed)
Continues to put off initial screening colonoscopy due to transition of medical care to [facility], which is taking way too long. I encouraged him to seek a GI MD there, call me for referral order if needed.  An iFOB 2013 was neg. Will repeat iFOB at next f/u visit.

## 2013-10-13 NOTE — Assessment & Plan Note (Signed)
Hx of noncompliance with statin, and his lipid panel has been acceptable on welchol only in recent past.  Recheck FLP today.

## 2013-10-13 NOTE — Assessment & Plan Note (Signed)
HbA1c today. Eye exam UTD: no diab retpthy (06/2013).

## 2013-10-27 ENCOUNTER — Other Ambulatory Visit: Payer: Self-pay | Admitting: Family Medicine

## 2013-11-29 ENCOUNTER — Other Ambulatory Visit: Payer: Self-pay | Admitting: Family Medicine

## 2014-01-04 ENCOUNTER — Other Ambulatory Visit: Payer: Self-pay

## 2014-01-28 ENCOUNTER — Other Ambulatory Visit: Payer: Self-pay | Admitting: Family Medicine

## 2014-01-28 MED ORDER — GLIMEPIRIDE 2 MG PO TABS: ORAL_TABLET | ORAL | Status: DC

## 2014-03-18 ENCOUNTER — Ambulatory Visit: Payer: BC Managed Care – PPO | Admitting: Family Medicine

## 2014-03-19 ENCOUNTER — Ambulatory Visit (INDEPENDENT_AMBULATORY_CARE_PROVIDER_SITE_OTHER): Payer: BC Managed Care – PPO | Admitting: Family Medicine

## 2014-03-19 ENCOUNTER — Encounter: Payer: Self-pay | Admitting: Family Medicine

## 2014-03-19 ENCOUNTER — Telehealth: Payer: Self-pay | Admitting: Family Medicine

## 2014-03-19 VITALS — BP 137/87 | HR 83 | Temp 97.4°F | Resp 18 | Ht 71.0 in | Wt 292.0 lb

## 2014-03-19 DIAGNOSIS — E785 Hyperlipidemia, unspecified: Secondary | ICD-10-CM

## 2014-03-19 DIAGNOSIS — I10 Essential (primary) hypertension: Secondary | ICD-10-CM

## 2014-03-19 DIAGNOSIS — Z1211 Encounter for screening for malignant neoplasm of colon: Secondary | ICD-10-CM

## 2014-03-19 DIAGNOSIS — D696 Thrombocytopenia, unspecified: Secondary | ICD-10-CM

## 2014-03-19 DIAGNOSIS — D72819 Decreased white blood cell count, unspecified: Secondary | ICD-10-CM

## 2014-03-19 DIAGNOSIS — K7581 Nonalcoholic steatohepatitis (NASH): Secondary | ICD-10-CM

## 2014-03-19 DIAGNOSIS — E119 Type 2 diabetes mellitus without complications: Secondary | ICD-10-CM

## 2014-03-19 LAB — CBC WITH DIFFERENTIAL/PLATELET
Basophils Absolute: 0 10*3/uL (ref 0.0–0.1)
Basophils Relative: 0.5 % (ref 0.0–3.0)
Eosinophils Absolute: 0.1 10*3/uL (ref 0.0–0.7)
Eosinophils Relative: 3.7 % (ref 0.0–5.0)
HCT: 42.7 % (ref 39.0–52.0)
HEMOGLOBIN: 14.4 g/dL (ref 13.0–17.0)
LYMPHS PCT: 21.7 % (ref 12.0–46.0)
Lymphs Abs: 0.8 10*3/uL (ref 0.7–4.0)
MCHC: 33.8 g/dL (ref 30.0–36.0)
MCV: 98.4 fl (ref 78.0–100.0)
MONO ABS: 0.4 10*3/uL (ref 0.1–1.0)
Monocytes Relative: 10.9 % (ref 3.0–12.0)
NEUTROS ABS: 2.2 10*3/uL (ref 1.4–7.7)
NEUTROS PCT: 63.2 % (ref 43.0–77.0)
Platelets: 70 10*3/uL — ABNORMAL LOW (ref 150.0–400.0)
RBC: 4.34 Mil/uL (ref 4.22–5.81)
RDW: 13.9 % (ref 11.5–15.5)
WBC: 3.5 10*3/uL — ABNORMAL LOW (ref 4.0–10.5)

## 2014-03-19 LAB — COMPREHENSIVE METABOLIC PANEL
ALT: 40 U/L (ref 0–53)
AST: 47 U/L — AB (ref 0–37)
Albumin: 3.7 g/dL (ref 3.5–5.2)
Alkaline Phosphatase: 64 U/L (ref 39–117)
BUN: 10 mg/dL (ref 6–23)
CO2: 27 mEq/L (ref 19–32)
Calcium: 8.6 mg/dL (ref 8.4–10.5)
Chloride: 106 mEq/L (ref 96–112)
Creatinine, Ser: 0.6 mg/dL (ref 0.4–1.5)
GFR: 143.51 mL/min (ref >60.00)
Glucose, Bld: 164 mg/dL — ABNORMAL HIGH (ref 70–99)
POTASSIUM: 3.8 meq/L (ref 3.5–5.1)
Sodium: 138 mEq/L (ref 135–145)
TOTAL PROTEIN: 7.2 g/dL (ref 6.0–8.3)
Total Bilirubin: 2.4 mg/dL — ABNORMAL HIGH (ref 0.2–1.2)

## 2014-03-19 LAB — MICROALBUMIN / CREATININE URINE RATIO
CREATININE, U: 195.6 mg/dL
MICROALB/CREAT RATIO: 0.5 mg/g (ref 0.0–30.0)
Microalb, Ur: 1 mg/dL (ref 0.0–1.9)

## 2014-03-19 LAB — LIPID PANEL
Cholesterol: 178 mg/dL (ref 0–200)
HDL: 49.7 mg/dL (ref >39.00)
LDL Cholesterol: 110 mg/dL — ABNORMAL HIGH (ref 0–99)
NonHDL: 128.3
TRIGLYCERIDES: 90 mg/dL (ref 0.0–149.0)
Total CHOL/HDL Ratio: 4
VLDL: 18 mg/dL (ref 0.0–40.0)

## 2014-03-19 LAB — HEMOGLOBIN A1C: Hgb A1c MFr Bld: 8.5 % — ABNORMAL HIGH (ref 4.6–6.5)

## 2014-03-19 NOTE — Assessment & Plan Note (Signed)
Trying to avoid hepatotoxic meds, or at least use them at lowest possible dosing. Encouraging low fat/low chol diet. Hopefully he'll eventually get established with a GI MD in [place]: he says he is going to do this for the purposes of getting colon cancer screening, so maybe he'll get f/u for NASH (repeat u/s?) through them as well. Repeat hepatic panel today.

## 2014-03-19 NOTE — Assessment & Plan Note (Signed)
HbA1c today: control seems improved per home measurements. Urine microalb/cr today. Will do diabetic foot exam at next f/u in 4 mo.

## 2014-03-19 NOTE — Assessment & Plan Note (Signed)
Historically has been stable. Secondary to NASH/portal HTN/splenomegaly. Repeat CBC today.

## 2014-03-19 NOTE — Progress Notes (Signed)
Pre visit review using our clinic review tool, if applicable. No additional management support is needed unless otherwise documented below in the visit note. 

## 2014-03-19 NOTE — Progress Notes (Signed)
OFFICE NOTE  03/19/2014  CC:  Chief Complaint  Patient presents with  . Follow-up    fasting  . Diabetes   HPI: Patient is a 53 y.o. Caucasian male who is here for 5 mo f/u DM 2, NASH, HTN, Hyperlipidemia. Started glimeperide 4m after last visit b/c A1c was up from 6.9 to 8.2%.   Overall glucose control is much improved. Compliant with all meds.  No new complaints.  No bp monitoring to report. [place] [facility] [place] soon as well so he can finally get a screening colonoscopy and possibly get further eval of his NASH.   Pertinent PMH:  Past medical, surgical, social, and family history reviewed and no changes are noted since last office visit.  MEDS:  Outpatient Prescriptions Prior to Visit  Medication Sig Dispense Refill  . b complex vitamins tablet Take 1 tablet by mouth daily.    .Marland KitchenBioflavonoid Products (BIOFLEX PO) Take 1 tablet by mouth 2 (two) times daily.      . Cholecalciferol (VITAMIN D3) 400 UNITS CAPS Take 1 capsule by mouth daily.    . colesevelam (WELCHOL) 625 MG tablet Take 3 tablets (1,875 mg total) by mouth 2 (two) times daily with a meal. 540 tablet 1  . glimepiride (AMARYL) 2 MG tablet TAKE 1 TABLET (2 MG TOTAL) BY MOUTH DAILY WITH BREAKFAST. 30 tablet 3  . ibuprofen (ADVIL,MOTRIN) 200 MG tablet Take 200 mg by mouth every 6 (six) hours as needed.    .Marland Kitchenlosartan (COZAAR) 50 MG tablet TAKE 1 TABLET (50 MG TOTAL) BY MOUTH DAILY. 30 tablet 5  . metFORMIN (GLUCOPHAGE) 500 MG tablet Take 1 tablet (500 mg total) by mouth daily. 1 tab po qd 90 tablet 1  . multivitamin (THERAGRAN) per tablet Take 1 tablet by mouth daily.      .Marland KitchenPOTASSIUM GLUCONATE PO Take 1 tablet by mouth daily.    .Marland Kitchenglucose blood (RELION CONFIRM/MICRO TEST) test strip Use as instructed 100 each 3  . atorvastatin (LIPITOR) 10 MG tablet Take 1 tablet (10 mg total) by mouth every other day. 45 tablet 1   No facility-administered medications prior to visit.    PE: Blood  pressure 137/87, pulse 83, temperature 97.4 F (36.3 C), temperature source Temporal, resp. rate 18, height 5' 11"  (1.803 m), weight 292 lb (132.45 kg), SpO2 92 %. Gen: Alert, well appearing.  Patient is oriented to person, place, time, and situation. CV: RRR, no m/r/g.   LUNGS: CTA bilat, nonlabored resps, good aeration in all lung fields. EXT: 2+ pitting edema in RLE, 1+ pitting edema in LLE SKIN no jaundice or pallor.  No bruising.  IMPRESSION AND PLAN:  Diabetes mellitus without complication [id number]: control seems improved per home measurements. Urine microalb/cr today. Will do diabetic foot exam at next f/u in 4 mo.  HTN (hypertension), benign The current medical regimen is effective;  continue present plan and medications. Lytes/cr today.  NASH (nonalcoholic steatohepatitis) Trying to avoid hepatotoxic meds, or at least use them at lowest possible dosing. Encouraging low fat/low chol diet. [place] going [place] do this for the purposes of getting colon cancer screening, so maybe he'll get f/u for NASH (repeat u/s?) through them as well. Repeat hepatic panel today.  Thrombocytopenia Historically has been stable. Secondary to NASH/portal HTN/splenomegaly. Repeat CBC today.  Colon cancer screening Pt stated today he wants to  continue pursuing a GI MD in [place], where he has been living now for quite some time, in order to get his initial screening colonoscopy.  Hyperlipidemia Hx of noncompliance with statin, plus he is a "borderline" candidate for statin given his NASH. Has hx of decent lipid panel while on welchol. Will repeat FLP today.   An After Visit Summary was printed and given to the patient.  FOLLOW UP: 4 mo

## 2014-03-19 NOTE — Assessment & Plan Note (Signed)
The current medical regimen is effective;  continue present plan and medications. Lytes/cr today. 

## 2014-03-19 NOTE — Telephone Encounter (Signed)
emmi emailed °

## 2014-03-19 NOTE — Assessment & Plan Note (Signed)
Pt stated today he wants to continue pursuing a GI MD in [place], where he has been living now for quite some time, in order to get his initial screening colonoscopy.

## 2014-03-19 NOTE — Assessment & Plan Note (Signed)
Hx of noncompliance with statin, plus he is a "borderline" candidate for statin given his NASH. Has hx of decent lipid panel while on welchol. Will repeat FLP today.

## 2014-03-28 ENCOUNTER — Other Ambulatory Visit: Payer: Self-pay | Admitting: Family Medicine

## 2014-03-28 MED ORDER — LIRAGLUTIDE 18 MG/3ML ~~LOC~~ SOPN: 1.2000 mL | PEN_INJECTOR | Freq: Every day | SUBCUTANEOUS | Status: DC

## 2014-04-04 ENCOUNTER — Telehealth: Payer: Self-pay | Admitting: Family Medicine

## 2014-04-04 MED ORDER — EXENATIDE 5 MCG/0.02ML ~~LOC~~ SOPN: 5.0000 ug | PEN_INJECTOR | Freq: Two times a day (BID) | SUBCUTANEOUS | Status: DC

## 2014-04-04 NOTE — Telephone Encounter (Signed)
Left detailed message on pt's cell.  Okay per DPR. 

## 2014-04-04 NOTE — Telephone Encounter (Signed)
Insurance won't cover [place]. Will do similar injection med: byetta. Pls tell him it is a TWICE a day injection that has to be given at the time of a meal and the injections have to be at least 6 hours apart. At the end of 1 mo, his dose will go from the "starter" dose to the "maintenance" dose--have him give [place] a call when he is on starting his last week of the "starter" dose and I'll send in the rx for the maintenance dose. Tell him to call if he has any problems with the med.-thx

## 2014-04-08 NOTE — Telephone Encounter (Signed)
Patient called back stating that his pharmacy doesn't supply pen needles with byetta.  Called [facility] and ordered pen needles.

## 2014-04-24 ENCOUNTER — Other Ambulatory Visit: Payer: Self-pay | Admitting: Family Medicine

## 2014-04-24 MED ORDER — LOSARTAN POTASSIUM 50 MG PO TABS: ORAL_TABLET | ORAL | Status: DC

## 2014-05-03 ENCOUNTER — Other Ambulatory Visit: Payer: Self-pay | Admitting: *Deleted

## 2014-05-03 MED ORDER — EXENATIDE 10 MCG/0.04ML ~~LOC~~ SOPN: 10.0000 ug | PEN_INJECTOR | Freq: Two times a day (BID) | SUBCUTANEOUS | Status: DC

## 2014-05-03 NOTE — Telephone Encounter (Signed)
I want him to increase his byetta dosing to the full dose, which is the 10 mcg/dose pen. [facility]. He'll still take one injection TWICE per day with meals.

## 2014-05-03 NOTE — Telephone Encounter (Signed)
Patient called and left vm requesting refill for his Byetta. Patient stated that his BS has been averaging around 168 over the last month and 133 for the last week. Patient stated if you are ok with his current dosing please send [facility] day supply to [facility]. Please advise?

## 2014-05-03 NOTE — Telephone Encounter (Signed)
Left detailed message per Dpr and at pt's request.  Advised pt to cb with any questions.

## 2014-05-07 ENCOUNTER — Other Ambulatory Visit: Payer: Self-pay | Admitting: Family Medicine

## 2014-05-07 MED ORDER — INSULIN PEN NEEDLE 33G X 4 MM MISC: 1.0000 "pen " | Freq: Two times a day (BID) | Status: DC

## 2014-06-18 ENCOUNTER — Other Ambulatory Visit: Payer: Self-pay

## 2014-06-18 MED ORDER — GLIMEPIRIDE 2 MG PO TABS: ORAL_TABLET | ORAL | Status: DC

## 2014-06-18 NOTE — Telephone Encounter (Signed)
Please Advise Refill Request? Refill request for- Glimepiride 2 mg tab Last filled by MD on - 01/28/14 Last Appt - 03/19/14        Next Appt - none schedule   Pharmacy- [facility]

## 2014-09-16 ENCOUNTER — Other Ambulatory Visit: Payer: Self-pay

## 2015-01-17 NOTE — Procedures (Signed)
Formal Overnight Polysomnography    [facility]  William Segovia, MD  Service Ralene Batheate: 01/17/2015    INDICATION FOR STUDY:  Suspicion of sleep disordered breathing.    DESCRIPTION OF PROCEDURE:  A standard overnight sleep study was  performed in the usual fashion with monitoring of EOG, EEG, EMG, ECG,  oxygenation, respiratory effort, airflow, as well as video monitoring.   The study was scored on an epoch-by-epoch basis by a registered  polysomnographic technician and independently reviewed by myself.    Study findings included a total recording time of 386 minutes and a  total sleep time of only 11 minutes, for a calculated sleep efficiency  of 3%.  Sleep was 24% in stage I and 76% in stage II.  There were no  witnessed apneic events.  The sleep tech noted the patient typically  goes to bed at 4:30 in the morning, as he works shifts.    IMPRESSION:  Insufficient sleep to comment on sleep quality was  obtained during this study.      William EtienneJason  Perle Gibbon, MD  TR: *n DD: 01/22/2015 13:57 TD: 01/22/2015 14:06 Job#: [id number]     DOC#: [id number]           cc:  William MollJeffrey Santi MD

## 2015-12-04 ENCOUNTER — Encounter (INDEPENDENT_AMBULATORY_CARE_PROVIDER_SITE_OTHER): Payer: Self-pay | Admitting: Internal Medicine

## 2015-12-29 ENCOUNTER — Ambulatory Visit (INDEPENDENT_AMBULATORY_CARE_PROVIDER_SITE_OTHER): Payer: BLUE CROSS/BLUE SHIELD | Admitting: Internal Medicine

## 2015-12-29 ENCOUNTER — Other Ambulatory Visit (INDEPENDENT_AMBULATORY_CARE_PROVIDER_SITE_OTHER): Payer: Self-pay | Admitting: Internal Medicine

## 2015-12-29 ENCOUNTER — Other Ambulatory Visit (INDEPENDENT_AMBULATORY_CARE_PROVIDER_SITE_OTHER): Payer: Self-pay | Admitting: *Deleted

## 2015-12-29 ENCOUNTER — Encounter (INDEPENDENT_AMBULATORY_CARE_PROVIDER_SITE_OTHER): Payer: Self-pay | Admitting: Internal Medicine

## 2015-12-29 VITALS — BP 130/84 | HR 72 | Temp 98.1°F | Ht 71.0 in | Wt 290.1 lb

## 2015-12-29 DIAGNOSIS — K7460 Unspecified cirrhosis of liver: Secondary | ICD-10-CM

## 2015-12-29 DIAGNOSIS — K7581 Nonalcoholic steatohepatitis (NASH): Secondary | ICD-10-CM

## 2015-12-29 DIAGNOSIS — I8500 Esophageal varices without bleeding: Secondary | ICD-10-CM

## 2015-12-29 DIAGNOSIS — D696 Thrombocytopenia, unspecified: Secondary | ICD-10-CM

## 2015-12-29 LAB — CBC WITH DIFFERENTIAL/PLATELET
BASOS PCT: 0 %
Basophils Absolute: 0 cells/uL (ref 0–200)
EOS ABS: 108 {cells}/uL (ref 15–500)
Eosinophils Relative: 3 %
HCT: 41.5 % (ref 38.5–50.0)
Hemoglobin: 14.1 g/dL (ref 13.2–17.1)
LYMPHS PCT: 19 %
Lymphs Abs: 684 cells/uL — ABNORMAL LOW (ref 850–3900)
MCH: 33.4 pg — ABNORMAL HIGH (ref 27.0–33.0)
MCHC: 34 g/dL (ref 32.0–36.0)
MCV: 98.3 fL (ref 80.0–100.0)
MPV: 13.2 fL — AB (ref 7.5–12.5)
Monocytes Absolute: 324 cells/uL (ref 200–950)
Monocytes Relative: 9 %
NEUTROS PCT: 69 %
Neutro Abs: 2484 cells/uL (ref 1500–7800)
Platelets: 67 10*3/uL — ABNORMAL LOW (ref 140–400)
RBC: 4.22 MIL/uL (ref 4.20–5.80)
RDW: 13.4 % (ref 11.0–15.0)
WBC: 3.6 10*3/uL — ABNORMAL LOW (ref 3.8–10.8)

## 2015-12-29 LAB — HEPATIC FUNCTION PANEL
ALT: 33 U/L (ref 9–46)
AST: 45 U/L — AB (ref 10–35)
Albumin: 3.4 g/dL — ABNORMAL LOW (ref 3.6–5.1)
Alkaline Phosphatase: 58 U/L (ref 40–115)
BILIRUBIN DIRECT: 0.8 mg/dL — AB (ref <=0.2)
Indirect Bilirubin: 4.1 mg/dL — ABNORMAL HIGH (ref 0.2–1.2)
TOTAL PROTEIN: 6.5 g/dL (ref 6.1–8.1)
Total Bilirubin: 4.9 mg/dL — ABNORMAL HIGH (ref 0.2–1.2)

## 2015-12-29 NOTE — Patient Instructions (Signed)
EGD with possible esophageal banding.   The risks and benefits such as perforation, bleeding, and infection were reviewed with the patient and is agreeable.
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
## 2015-12-30 ENCOUNTER — Encounter (INDEPENDENT_AMBULATORY_CARE_PROVIDER_SITE_OTHER): Payer: Self-pay | Admitting: *Deleted

## 2015-12-30 DIAGNOSIS — I8500 Esophageal varices without bleeding: Secondary | ICD-10-CM | POA: Insufficient documentation

## 2015-12-30 DIAGNOSIS — K7460 Unspecified cirrhosis of liver: Secondary | ICD-10-CM | POA: Insufficient documentation

## 2015-12-30 LAB — SEDIMENTATION RATE: Sed Rate: 1 mm/hr (ref 0–20)

## 2015-12-30 LAB — AMMONIA: Ammonia: 58 umol/L — ABNORMAL HIGH (ref <=47)

## 2015-12-30 LAB — PROTIME-INR
INR: 1.2 — ABNORMAL HIGH
Prothrombin Time: 12.8 s — ABNORMAL HIGH (ref 9.0–11.5)

## 2015-12-31 LAB — CERULOPLASMIN: Ceruloplasmin: 19 mg/dL (ref 18–36)

## 2015-12-31 LAB — ALPHA-1-ANTITRYPSIN: A-1 Antitrypsin, Ser: 108 mg/dL (ref 83–199)

## 2016-01-01 LAB — ANTI-SMOOTH MUSCLE ANTIBODY, IGG: SMOOTH MUSCLE AB: 30 U — AB (ref <20)

## 2016-01-02 ENCOUNTER — Encounter (INDEPENDENT_AMBULATORY_CARE_PROVIDER_SITE_OTHER): Payer: Self-pay | Admitting: Internal Medicine

## 2016-01-03 ENCOUNTER — Encounter: Payer: Self-pay | Admitting: Family Medicine

## 2016-01-06 ENCOUNTER — Ambulatory Visit (HOSPITAL_COMMUNITY): Payer: BLUE CROSS/BLUE SHIELD

## 2016-01-13 ENCOUNTER — Telehealth (INDEPENDENT_AMBULATORY_CARE_PROVIDER_SITE_OTHER): Payer: Self-pay | Admitting: Internal Medicine

## 2016-01-13 ENCOUNTER — Other Ambulatory Visit (INDEPENDENT_AMBULATORY_CARE_PROVIDER_SITE_OTHER): Payer: Self-pay | Admitting: *Deleted

## 2016-01-13 DIAGNOSIS — R17 Unspecified jaundice: Secondary | ICD-10-CM

## 2016-01-13 NOTE — Telephone Encounter (Signed)
Message left on answering machine asking if he has ever had a liver biopsy.

## 2016-01-14 ENCOUNTER — Encounter (INDEPENDENT_AMBULATORY_CARE_PROVIDER_SITE_OTHER): Payer: Self-pay | Admitting: *Deleted

## 2016-01-14 ENCOUNTER — Encounter (INDEPENDENT_AMBULATORY_CARE_PROVIDER_SITE_OTHER): Payer: Self-pay | Admitting: Internal Medicine

## 2016-01-15 ENCOUNTER — Other Ambulatory Visit (INDEPENDENT_AMBULATORY_CARE_PROVIDER_SITE_OTHER): Payer: Self-pay | Admitting: *Deleted

## 2016-01-15 ENCOUNTER — Encounter (INDEPENDENT_AMBULATORY_CARE_PROVIDER_SITE_OTHER): Payer: Self-pay | Admitting: *Deleted

## 2016-01-15 DIAGNOSIS — R17 Unspecified jaundice: Secondary | ICD-10-CM

## 2016-01-21 ENCOUNTER — Encounter (INDEPENDENT_AMBULATORY_CARE_PROVIDER_SITE_OTHER): Payer: Self-pay | Admitting: Internal Medicine

## 2016-01-26 ENCOUNTER — Encounter (INDEPENDENT_AMBULATORY_CARE_PROVIDER_SITE_OTHER): Payer: Self-pay | Admitting: Internal Medicine

## 2016-02-06 ENCOUNTER — Encounter (INDEPENDENT_AMBULATORY_CARE_PROVIDER_SITE_OTHER): Payer: Self-pay

## 2016-03-17 ENCOUNTER — Ambulatory Visit (HOSPITAL_COMMUNITY)
Admission: RE | Admit: 2016-03-17 | Discharge: 2016-03-17 | Disposition: A | Discharge status: Home / Self Care | Payer: BLUE CROSS/BLUE SHIELD | Source: Ambulatory Visit | Attending: Internal Medicine | Admitting: Internal Medicine

## 2016-03-17 ENCOUNTER — Encounter (HOSPITAL_COMMUNITY): Payer: Self-pay | Admitting: *Deleted

## 2016-03-17 ENCOUNTER — Encounter (HOSPITAL_COMMUNITY)
Admission: RE | Disposition: A | Discharge status: Home / Self Care | Payer: Self-pay | Source: Ambulatory Visit | Attending: Internal Medicine

## 2016-03-17 DIAGNOSIS — E119 Type 2 diabetes mellitus without complications: Secondary | ICD-10-CM | POA: Diagnosis not present

## 2016-03-17 DIAGNOSIS — E669 Obesity, unspecified: Secondary | ICD-10-CM | POA: Diagnosis not present

## 2016-03-17 DIAGNOSIS — I8500 Esophageal varices without bleeding: Secondary | ICD-10-CM | POA: Insufficient documentation

## 2016-03-17 DIAGNOSIS — K3189 Other diseases of stomach and duodenum: Secondary | ICD-10-CM

## 2016-03-17 DIAGNOSIS — K228 Other specified diseases of esophagus: Secondary | ICD-10-CM | POA: Diagnosis not present

## 2016-03-17 DIAGNOSIS — Z7984 Long term (current) use of oral hypoglycemic drugs: Secondary | ICD-10-CM | POA: Insufficient documentation

## 2016-03-17 DIAGNOSIS — K7460 Unspecified cirrhosis of liver: Secondary | ICD-10-CM | POA: Insufficient documentation

## 2016-03-17 DIAGNOSIS — K766 Portal hypertension: Secondary | ICD-10-CM | POA: Insufficient documentation

## 2016-03-17 DIAGNOSIS — D696 Thrombocytopenia, unspecified: Secondary | ICD-10-CM | POA: Insufficient documentation

## 2016-03-17 DIAGNOSIS — K222 Esophageal obstruction: Secondary | ICD-10-CM | POA: Insufficient documentation

## 2016-03-17 DIAGNOSIS — K2210 Ulcer of esophagus without bleeding: Secondary | ICD-10-CM | POA: Diagnosis not present

## 2016-03-17 DIAGNOSIS — K7581 Nonalcoholic steatohepatitis (NASH): Secondary | ICD-10-CM | POA: Insufficient documentation

## 2016-03-17 DIAGNOSIS — Z6839 Body mass index (BMI) 39.0-39.9, adult: Secondary | ICD-10-CM | POA: Diagnosis not present

## 2016-03-17 DIAGNOSIS — E785 Hyperlipidemia, unspecified: Secondary | ICD-10-CM | POA: Insufficient documentation

## 2016-03-17 HISTORY — PX: ESOPHAGEAL BANDING: SHX5518

## 2016-03-17 HISTORY — PX: ESOPHAGOGASTRODUODENOSCOPY: SHX5428

## 2016-03-17 LAB — GLUCOSE, CAPILLARY: GLUCOSE-CAPILLARY: 97 mg/dL (ref 65–99)

## 2016-03-17 SURGERY — EGD (ESOPHAGOGASTRODUODENOSCOPY)
Anesthesia: Moderate Sedation

## 2016-03-17 MED ORDER — SODIUM CHLORIDE 0.9 % IV SOLN
INTRAVENOUS | Status: DC
  Administered 2016-03-17: 11:00:00 via INTRAVENOUS

## 2016-03-17 MED ORDER — STERILE WATER FOR IRRIGATION IR SOLN
Status: DC | PRN
  Administered 2016-03-17: 2.5 mL

## 2016-03-17 MED ORDER — MIDAZOLAM HCL 5 MG/5ML IJ SOLN
INTRAMUSCULAR | Status: DC | PRN
  Administered 2016-03-17: 1 mg via INTRAVENOUS
  Administered 2016-03-17 (×2): 2 mg via INTRAVENOUS
  Administered 2016-03-17: 1 mg via INTRAVENOUS
  Administered 2016-03-17: 2 mg via INTRAVENOUS

## 2016-03-17 MED ORDER — MIDAZOLAM HCL 5 MG/5ML IJ SOLN
INTRAMUSCULAR | Status: AC
  Filled 2016-03-17: qty 10

## 2016-03-17 MED ORDER — BUTAMBEN-TETRACAINE-BENZOCAINE 2-2-14 % EX AERO
INHALATION_SPRAY | CUTANEOUS | Status: DC | PRN
  Administered 2016-03-17: 1 via TOPICAL

## 2016-03-17 MED ORDER — NADOLOL 20 MG PO TABS: 20.0000 mg | ORAL_TABLET | Freq: Every day | ORAL | 5 refills | Status: DC

## 2016-03-17 MED ORDER — PANTOPRAZOLE SODIUM 40 MG PO TBEC: 40.0000 mg | DELAYED_RELEASE_TABLET | Freq: Every day | ORAL | 5 refills | Status: DC

## 2016-03-17 MED ORDER — MEPERIDINE HCL 50 MG/ML IJ SOLN
INTRAMUSCULAR | Status: AC
  Filled 2016-03-17: qty 1

## 2016-03-17 MED ORDER — MEPERIDINE HCL 50 MG/ML IJ SOLN
INTRAMUSCULAR | Status: DC | PRN
  Administered 2016-03-17 (×2): 25 mg via INTRAVENOUS
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
## 2016-03-17 NOTE — Discharge Instructions (Signed)
Resume usual medications. Corgard/Nadolol 20 mg by mouth daily. Pantoprazole 40 mg by mouth 30 minutes before breakfast daily. Full liquids or soft diet for 24 hours. No driving for 24 hours. Repeat EGD with banding in 3 months.       Esophagogastroduodenoscopy, Care After Introduction Refer to this sheet in the next few weeks. These instructions provide you with information about caring for yourself after your procedure. Your health care provider may also give you more specific instructions. Your treatment has been planned according to current medical practices, but problems sometimes occur. Call your health care provider if you have any problems or questions after your procedure. What can I expect after the procedure? After the procedure, it is common to have:  A sore throat.  Nausea.  Bloating.  Dizziness.  Fatigue. Follow these instructions at home:  Do not eat or drink anything until the numbing medicine (local anesthetic) has worn off and your gag reflex has returned. You will know that the local anesthetic has worn off when you can swallow comfortably.  Do not drive for 24 hours if you received a medicine to help you relax (sedative).  If your health care provider took a tissue sample for testing during the procedure, make sure to get your test results. This is your responsibility. Ask your health care provider or the department performing the test when your results will be ready.  Keep all follow-up visits as told by your health care provider. This is important. Contact a health care provider if:  You cannot stop coughing.  You are not urinating.  You are urinating less than usual. Get help right away if:  You have trouble swallowing.  You cannot eat or drink.  You have throat or chest pain that gets worse.  You are dizzy or light-headed.  You faint.  You have nausea or vomiting.  You have chills.  You have a fever.  You have severe abdominal  pain.  You have black, tarry, or bloody stools. This information is not intended to replace advice given to you by your health care provider. Make sure you discuss any questions you have with your health care provider. Document Released: 02/23/2012 Document Revised: 08/14/2015 Document Reviewed: 01/30/2015  2017 Elsevier  Full Liquid Diet A full liquid diet may be used:  To help you transition from a clear liquid diet to a soft diet.  When your body is healing and can only tolerate foods that are easy to digest.  Before or after certain a procedure, test, or surgery (such as stomach or intestinal surgeries).  If you have trouble swallowing or chewing. A full liquid diet includes fluids and foods that are liquid or will become liquid at room temperature. The full liquid diet gives you the proteins, fluids, salts, and minerals that you need for energy. If you continue this diet for more than 72 hours, talk to your health care provider about how many calories you need to consume. If you continue the diet for more than 5 days, talk to your health care provider about taking a multivitamin or a nutritional supplement. What do I need to know about a full liquid diet?  You may have any liquid.  You may have any food that becomes a liquid at room temperature. The food is considered a liquid if it can be poured off a spoon at room temperature.  Drink one serving of citrus or vitamin C-enriched fruit juice daily. What foods can I eat? Grains  Any grain  food that can be pureed in soup (such as crackers, pasta, and rice). Hot cereal (such as farina or oatmeal) that has been blended. Talk to your health care provider or dietitian about these foods. Vegetables  Pulp-free tomato or vegetable juice. Vegetables pureed in soup. Fruits  Fruit juice, including nectars and juices with pulp. Meats and Other Protein Sources  Eggs in custard, eggnog mix, and eggs used in ice cream or pudding. Strained  meats, like in baby food, may be allowed. Consult your health care provider. Dairy  Milk and milk-based beverages, including milk shakes and instant breakfast mixes. Smooth yogurt. Pureed cottage cheese. Avoid these foods if they are not well tolerated. Beverages  All beverages, including liquid nutritional supplements. Ask your health care provider if you can have carbonated beverages. They may not be well tolerated. Condiments  Iodized salt, pepper, spices, and flavorings. Cocoa powder. Vinegar, ketchup, yellow mustard, smooth sauces (such as hollandaise, cheese sauce, or white sauce), and soy sauce. Sweets and Desserts  Custard, smooth pudding. Flavored gelatin. Tapioca, junket. Plain ice cream, sherbet, fruit ices. Frozen ice pops, frozen fudge pops, pudding pops, and other frozen bars with cream. Syrups, including chocolate syrup. Sugar, honey, jelly. Fats and Oils  Margarine, butter, cream, sour cream, and oils. Other  Broth and cream soups. Strained, broth-based soups. The items listed above may not be a complete list of recommended foods or beverages. Contact your dietitian for more options.  What foods can I not eat? Grains  All breads. Grains are not allowed unless they are pureed into soup. Vegetables  Vegetables are not allowed unless they are juiced, or cooked and pureed into soup. Fruits  Fruits are not allowed unless they are juiced. Meats and Other Protein Sources  Any meat or fish. Cooked or raw eggs. Nut butters. Dairy  Cheese. Condiments  Stone ground mustards. Fats and Oils  Fats that are coarse or chunky. Sweets and Desserts  Ice cream or other frozen desserts that have any solids in them or on top, such as nuts, chocolate chips, and pieces of cookies. Cakes. Cookies. Candy. Others  Soups with chunks or pieces in them. The items listed above may not be a complete list of foods and beverages to avoid. Contact your dietitian for more information.  This information  is not intended to replace advice given to you by your health care provider. Make sure you discuss any questions you have with your health care provider. Document Released: 03/08/2005 Document Revised: 08/14/2015 Document Reviewed: 01/11/2013 Elsevier Interactive Patient Education  2017 [place] Meal Plan A soft-food meal plan includes foods that are safe and easy to swallow. This meal plan typically is used:  If you are having trouble chewing or swallowing foods.  As a transition meal plan after only having had liquid meals for a long period. What do I need to know about the soft-food meal plan? A soft-food meal plan includes tender foods that are soft and easy to chew and swallow. In most cases, bite-sized pieces of food are easier to swallow. A bite-sized piece is about  inch or smaller. Foods in this plan do not need to be ground or pureed. Foods that are very hard, crunchy, or sticky should be avoided. Also, breads, cereals, yogurts, and desserts with nuts, seeds, or fruits should be avoided. What foods can I eat? Grains  Rice and wild rice. Moist bread, dressing, pasta, and noodles. Well-moistened dry or cooked cereals, such as farina (cooked wheat cereal),  oatmeal, or grits. Biscuits, breads, muffins, pancakes, and waffles that have been well moistened. Vegetables  Shredded lettuce. Cooked, tender vegetables, including potatoes without skins. Vegetable juices. Broths or creamed soups made with vegetables that are not stringy or chewy. Strained tomatoes (without seeds). Fruits  Canned or well-cooked fruits. Soft (ripe), peeled fresh fruits, such as peaches, nectarines, kiwi, cantaloupe, honeydew melon, and watermelon (without seeds). Soft berries with small seeds, such as strawberries. Fruit juices (without pulp). Meats and Other Protein Sources  Moist, tender, lean beef. Mutton. Lamb. Veal. Chicken. [place]. Liver. Ham. Fish without bones. Eggs. Dairy  Milk, milk  drinks, and cream. Plain cream cheese and cottage cheese. Plain yogurt. Sweets/Desserts  Flavored gelatin desserts. Custard. Plain ice cream, frozen yogurt, sherbet, milk shakes, and malts. Plain cakes and cookies. Plain hard candy. Other  Butter, margarine (without trans fat), and cooking oils. Mayonnaise. Cream sauces. Mild spices, salt, and sugar. Syrup, molasses, honey, and jelly. The items listed above may not be a complete list of recommended foods or beverages. Contact your dietitian for more options.  What foods are not recommended? Grains  Dry bread, toast, crackers that have not been moistened. Coarse or dry cereals, such as bran, granola, and shredded wheat. Tough or chewy crusty breads, such as [place] bread or baguettes. Vegetables  Corn. Raw vegetables except shredded lettuce. Cooked vegetables that are tough or stringy. Tough, crisp, fried potatoes and potato skins. Fruits  Fresh fruits with skins or seeds or both, such as apples, pears, or grapes. Stringy, high-pulp fruits, such as papaya, pineapple, coconut, or mango. Fruit leather, fruit roll-ups, and all dried fruits. Meats and Other Protein Sources  Sausages and hot dogs. Meats with gristle. Fish with bones. Nuts, seeds, and chunky peanut or other nut butters. Sweets/Desserts  Cakes or cookies that are very dry or chewy. The items listed above may not be a complete list of foods and beverages to avoid. Contact your dietitian for more information.  This information is not intended to replace advice given to you by your health care provider. Make sure you discuss any questions you have with your health care provider. Document Released: 06/15/2007 Document Revised: 08/14/2015 Document Reviewed: 02/02/2013 Elsevier Interactive Patient Education  2017 [employer].

## 2016-03-17 NOTE — Op Note (Signed)
[facility] Patient Name: Shawn Ward Procedure Date: 03/17/2016 11:13 AM MRN: [id number] Date of Birth: Jul 29, 1960 Attending MD: Hildred Laser , MD CSN: [id number] Age: 55 Admit Type: Outpatient Procedure:                Upper GI endoscopy Indications:              For therapy of esophageal varices Providers:                Hildred Laser, MD, Otis Peak B. Sharon Seller, RN, Aram Candela Referring MD:             Cresenciano Lick, PA Medicines:                Cetacaine spray, Meperidine 50 mg IV, Midazolam 8                            mg IV Complications:            No immediate complications. Estimated Blood Loss:     Estimated blood loss was minimal. Procedure:                Pre-Anesthesia Assessment:                           - Prior to the procedure, a History and Physical                            was performed, and patient medications and                            allergies were reviewed. The patient's tolerance of                            previous anesthesia was also reviewed. The risks                            and benefits of the procedure and the sedation                            options and risks were discussed with the patient.                            All questions were answered, and informed consent                            was obtained. Prior Anticoagulants: The patient has                            taken no previous anticoagulant or antiplatelet                            agents. ASA Grade Assessment: III - A patient with  severe systemic disease. After reviewing the risks                            and benefits, the patient was deemed in                            satisfactory condition to undergo the procedure.                           After obtaining informed consent, the endoscope was                            passed under direct vision. Throughout the                            procedure, the  patient's blood pressure, pulse, and                            oxygen saturations were monitored continuously. The                            [id number] ([id number]) scope was introduced through the                            mouth, and advanced to the second part of duodenum.                            The upper GI endoscopy was accomplished without                            difficulty. The patient tolerated the procedure                            well. Scope In: 62:83:15 AM Scope Out: 12:10:00 PM Total Procedure Duration: 0 hours 13 minutes 52 seconds  Findings:      The proximal esophagus was normal.      Grade III varices were found in the mid esophagus and in the distal       esophagus. Three bands were successfully placed with incomplete       eradication of varices. There was no bleeding during, and at the end, of       the procedure.      One mild (non-circumferential scarring) benign-appearing, intrinsic       stenosis was found 36 cm from the incisors. And was traversed.      A post variceal banding scar was found in the distal esophagus.      One superficial esophageal ulcer with no bleeding and no stigmata of       recent bleeding was found 39 cm from the incisors. The lesion was three       mm by five mm in largest dimension.      The Z-line was regular and was found 39 cm from the incisors.      Mild, moderate portal hypertensive gastropathy was found in the entire       examined stomach.      The duodenal  bulb was normal.      Diffuse mildly congested mucosa without active bleeding and with no       stigmata of bleeding was found in the second portion of the duodenum. Impression:               - Normal proximal esophagus.                           - Grade III esophageal varices. Four columns. Three                            columns were banded. Incompletely eradicated.                           - Benign-appearing esophageal stenosis.                           - Scar in  the distal esophagus.                           - Non-bleeding esophageal ulcer at GEJ.                           - Z-line regular, 39 cm from the incisors.                           - Portal hypertensive gastropathy.                           - Normal duodenal bulb.                           - Congested duodenal mucosa.                           - No specimens collected. Moderate Sedation:      Moderate (conscious) sedation was administered by the endoscopy nurse       and supervised by the endoscopist. The following parameters were       monitored: oxygen saturation, heart rate, blood pressure, CO2       capnography and response to care. Total physician intraservice time was       21 minutes. Recommendation:           - Patient has a contact number available for                            emergencies. The signs and symptoms of potential                            delayed complications were discussed with the                            patient. Return to normal activities tomorrow.                            Written discharge instructions were provided to the  patient.                           - Full liquid diet today.                           - Resume previous diet [Duration]tomorrow.                           - Continue present medications.                           - Use Protonix (pantoprazole) 40 mg PO daily.                           - Give a beta blocker with dosage titrated by the                            heart rate. Procedure Code(s):        --- Professional ---                           [phone], Esophagogastroduodenoscopy, flexible,                            transoral; with band ligation of esophageal/gastric                            varices                           99152, Moderate sedation services provided by the                            same physician or other qualified health care                            professional performing the  diagnostic or                            therapeutic service that the sedation supports,                            requiring the presence of an independent trained                            observer to assist in the monitoring of the                            patient's level of consciousness and physiological                            status; initial 15 minutes of intraservice time,                            patient age 61 years or older Diagnosis Code(s):        ---  Professional ---                           I85.00, Esophageal varices without bleeding                           K22.2, Esophageal obstruction                           K22.8, Other specified diseases of esophagus                           K22.10, Ulcer of esophagus without bleeding                           K76.6, Portal hypertension                           K31.89, Other diseases of stomach and duodenum CPT copyright 2016 American Medical Association. All rights reserved. The codes documented in this report are preliminary and upon coder review may  be revised to meet current compliance requirements. Hildred Laser, MD Hildred Laser, MD 03/17/2016 30:74:60 PM This report has been signed electronically. Number of Addenda: 0

## 2016-03-18 ENCOUNTER — Encounter (INDEPENDENT_AMBULATORY_CARE_PROVIDER_SITE_OTHER): Payer: Self-pay | Admitting: *Deleted

## 2016-03-18 ENCOUNTER — Encounter (HOSPITAL_COMMUNITY): Payer: Self-pay | Admitting: Internal Medicine

## 2016-03-18 ENCOUNTER — Other Ambulatory Visit (INDEPENDENT_AMBULATORY_CARE_PROVIDER_SITE_OTHER): Payer: Self-pay | Admitting: *Deleted

## 2016-03-18 DIAGNOSIS — I8500 Esophageal varices without bleeding: Secondary | ICD-10-CM

## 2016-03-28 ENCOUNTER — Encounter (INDEPENDENT_AMBULATORY_CARE_PROVIDER_SITE_OTHER): Payer: Self-pay | Admitting: Internal Medicine

## 2016-03-29 ENCOUNTER — Telehealth (INDEPENDENT_AMBULATORY_CARE_PROVIDER_SITE_OTHER): Payer: Self-pay | Admitting: Internal Medicine

## 2016-03-29 ENCOUNTER — Encounter (INDEPENDENT_AMBULATORY_CARE_PROVIDER_SITE_OTHER): Payer: Self-pay | Admitting: Internal Medicine

## 2016-03-29 NOTE — Telephone Encounter (Signed)
error 

## 2016-03-31 ENCOUNTER — Other Ambulatory Visit (INDEPENDENT_AMBULATORY_CARE_PROVIDER_SITE_OTHER): Payer: Self-pay | Admitting: *Deleted

## 2016-03-31 ENCOUNTER — Telehealth (INDEPENDENT_AMBULATORY_CARE_PROVIDER_SITE_OTHER): Payer: Self-pay | Admitting: *Deleted

## 2016-03-31 MED ORDER — PANTOPRAZOLE SODIUM 40 MG PO TBEC: 40.0000 mg | DELAYED_RELEASE_TABLET | Freq: Every day | ORAL | 3 refills | Status: DC

## 2016-03-31 NOTE — Telephone Encounter (Signed)
Patient's spouse called, new pharmacy is [facility].  Stated that the generic Protonix that was called in to [facility] needs to be sent to [facility] otherwise its gonna cost 10 times more.  [id number]

## 2016-03-31 NOTE — Telephone Encounter (Signed)
Patient had EGD 03/17/16 -- the nurse left me a message that day that he need repeat 2 months, he is sch'd 05/19/16 patient and his wife state you told them 3 months, please advise if ok to leave on 2/28 or do we need to move out another month

## 2016-03-31 NOTE — Telephone Encounter (Signed)
A Rx was sent to the patient's mail order pharmacy,[facility]. Patient's wife was made aware.

## 2016-03-31 NOTE — Telephone Encounter (Signed)
Patient's wife ask that the rx be sent to [facility] .  A rx was sent to [facility].

## 2016-04-01 NOTE — Telephone Encounter (Signed)
I would recommend waiting 3 months from his last procedure.Shawn Ward

## 2016-04-02 ENCOUNTER — Encounter (INDEPENDENT_AMBULATORY_CARE_PROVIDER_SITE_OTHER): Payer: Self-pay | Admitting: *Deleted

## 2016-04-02 NOTE — Telephone Encounter (Signed)
EGD sch'd to 06/16/16, left message for patient and mailed new instruction sheet

## 2016-04-05 ENCOUNTER — Encounter (INDEPENDENT_AMBULATORY_CARE_PROVIDER_SITE_OTHER): Payer: Self-pay

## 2016-06-16 ENCOUNTER — Encounter (HOSPITAL_COMMUNITY)
Admission: RE | Disposition: A | Discharge status: Home / Self Care | Payer: Self-pay | Source: Ambulatory Visit | Attending: Internal Medicine

## 2016-06-16 ENCOUNTER — Encounter (HOSPITAL_COMMUNITY): Payer: Self-pay | Admitting: *Deleted

## 2016-06-16 ENCOUNTER — Ambulatory Visit (HOSPITAL_COMMUNITY)
Admission: RE | Admit: 2016-06-16 | Discharge: 2016-06-16 | Disposition: A | Discharge status: Home / Self Care | Payer: BLUE CROSS/BLUE SHIELD | Source: Ambulatory Visit | Attending: Internal Medicine | Admitting: Internal Medicine

## 2016-06-16 DIAGNOSIS — E669 Obesity, unspecified: Secondary | ICD-10-CM | POA: Diagnosis not present

## 2016-06-16 DIAGNOSIS — I10 Essential (primary) hypertension: Secondary | ICD-10-CM | POA: Insufficient documentation

## 2016-06-16 DIAGNOSIS — Z6838 Body mass index (BMI) 38.0-38.9, adult: Secondary | ICD-10-CM | POA: Diagnosis not present

## 2016-06-16 DIAGNOSIS — K7581 Nonalcoholic steatohepatitis (NASH): Secondary | ICD-10-CM | POA: Diagnosis not present

## 2016-06-16 DIAGNOSIS — K7460 Unspecified cirrhosis of liver: Secondary | ICD-10-CM | POA: Diagnosis not present

## 2016-06-16 DIAGNOSIS — I8500 Esophageal varices without bleeding: Secondary | ICD-10-CM | POA: Diagnosis not present

## 2016-06-16 DIAGNOSIS — K766 Portal hypertension: Secondary | ICD-10-CM | POA: Diagnosis not present

## 2016-06-16 DIAGNOSIS — K3189 Other diseases of stomach and duodenum: Secondary | ICD-10-CM

## 2016-06-16 DIAGNOSIS — Z13810 Encounter for screening for upper gastrointestinal disorder: Secondary | ICD-10-CM | POA: Diagnosis not present

## 2016-06-16 DIAGNOSIS — E785 Hyperlipidemia, unspecified: Secondary | ICD-10-CM | POA: Diagnosis not present

## 2016-06-16 DIAGNOSIS — E119 Type 2 diabetes mellitus without complications: Secondary | ICD-10-CM | POA: Diagnosis not present

## 2016-06-16 DIAGNOSIS — Z7984 Long term (current) use of oral hypoglycemic drugs: Secondary | ICD-10-CM | POA: Diagnosis not present

## 2016-06-16 DIAGNOSIS — I872 Venous insufficiency (chronic) (peripheral): Secondary | ICD-10-CM | POA: Insufficient documentation

## 2016-06-16 DIAGNOSIS — K228 Other specified diseases of esophagus: Secondary | ICD-10-CM | POA: Diagnosis not present

## 2016-06-16 HISTORY — PX: ESOPHAGOGASTRODUODENOSCOPY: SHX5428

## 2016-06-16 HISTORY — PX: ESOPHAGEAL BANDING: SHX5518

## 2016-06-16 LAB — GLUCOSE, CAPILLARY: Glucose-Capillary: 88 mg/dL (ref 65–99)

## 2016-06-16 SURGERY — EGD (ESOPHAGOGASTRODUODENOSCOPY)
Anesthesia: Moderate Sedation

## 2016-06-16 MED ORDER — MEPERIDINE HCL 50 MG/ML IJ SOLN
INTRAMUSCULAR | Status: DC
Start: 2016-06-16 — End: 2016-06-16
  Filled 2016-06-16: qty 1

## 2016-06-16 MED ORDER — MIDAZOLAM HCL 5 MG/5ML IJ SOLN
INTRAMUSCULAR | Status: DC | PRN
  Administered 2016-06-16 (×4): 2 mg via INTRAVENOUS

## 2016-06-16 MED ORDER — SODIUM CHLORIDE 0.9 % IV SOLN
INTRAVENOUS | Status: DC
  Administered 2016-06-16: 1000 mL via INTRAVENOUS

## 2016-06-16 MED ORDER — LIDOCAINE VISCOUS 2 % MT SOLN
OROMUCOSAL | Status: AC
  Filled 2016-06-16: qty 15

## 2016-06-16 MED ORDER — MIDAZOLAM HCL 5 MG/5ML IJ SOLN
INTRAMUSCULAR | Status: AC
  Filled 2016-06-16: qty 10

## 2016-06-16 MED ORDER — LIDOCAINE HCL 2 % EX GEL
CUTANEOUS | Status: DC | PRN
  Administered 2016-06-16: 1

## 2016-06-16 MED ORDER — MEPERIDINE HCL 50 MG/ML IJ SOLN
INTRAMUSCULAR | Status: DC | PRN
  Administered 2016-06-16 (×2): 25 mg via INTRAVENOUS

## 2016-06-16 MED ORDER — STERILE WATER FOR IRRIGATION IR SOLN
Status: DC | PRN
  Administered 2016-06-16: 2.5 mL

## 2016-06-16 NOTE — Discharge Instructions (Signed)
Full liquids today and then usual diet starting tomorrow morning. Resume usual medications. No driving for 24 hours. Office visit in 3 months.  Esophagogastroduodenoscopy, Care After Refer to this sheet in the next few weeks. These instructions provide you with information about caring for yourself after your procedure. Your health care provider may also give you more specific instructions. Your treatment has been planned according to current medical practices, but problems sometimes occur. Call your health care provider if you have any problems or questions after your procedure. What can I expect after the procedure? After the procedure, it is common to have:  A sore throat.  Nausea.  Bloating.  Dizziness.  Fatigue. Follow these instructions at home:  Do not eat or drink anything until the numbing medicine (local anesthetic) has worn off and your gag reflex has returned. You will know that the local anesthetic has worn off when you can swallow comfortably.  Do not drive for 24 hours if you received a medicine to help you relax (sedative).  If your health care provider took a tissue sample for testing during the procedure, make sure to get your test results. This is your responsibility. Ask your health care provider or the department performing the test when your results will be ready.  Keep all follow-up visits as told by your health care provider. This is important. Contact a health care provider if:  You cannot stop coughing.  You are not urinating.  You are urinating less than usual. Get help right away if:  You have trouble swallowing.  You cannot eat or drink.  You have throat or chest pain that gets worse.  You are dizzy or light-headed.  You faint.  You have nausea or vomiting.  You have chills.  You have a fever.  You have severe abdominal pain.  You have black, tarry, or bloody stools. This information is not intended to replace advice given to you by  your health care provider. Make sure you discuss any questions you have with your health care provider. Document Released: 02/23/2012 Document Revised: 08/14/2015 Document Reviewed: 01/30/2015 Elsevier Interactive Patient Education  2017 [employer]    Moderate Conscious Sedation, Adult Sedation is the use of medicines to promote relaxation and relieve discomfort and anxiety. Moderate conscious sedation is a type of sedation. Under moderate conscious sedation, you are less alert than normal, but you are still able to respond to instructions, touch, or both. Moderate conscious sedation is used during short medical and dental procedures. It is milder than deep sedation, which is a type of sedation under which you cannot be easily woken up. It is also milder than general anesthesia, which is the use of medicines to make you unconscious. Moderate conscious sedation allows you to return to your regular activities sooner. Tell a health care provider about: Any allergies you have. All medicines you are taking, including vitamins, herbs, eye drops, creams, and over-the-counter medicines. Use of steroids (by mouth or creams). Any problems you or family members have had with sedatives and anesthetic medicines. Any blood disorders you have. Any surgeries you have had. Any medical conditions you have, such as sleep apnea. Whether you are pregnant or may be pregnant. Any use of cigarettes, alcohol, marijuana, or street drugs. What are the risks? Generally, this is a safe procedure. However, problems may occur, including: Getting too much medicine (oversedation). Nausea. Allergic reaction to medicines. Trouble breathing. If this happens, a breathing tube may be used to help with breathing. It will  be removed when you are awake and breathing on your own. Heart trouble. Lung trouble. What happens before the procedure? Staying hydrated  Follow instructions from your health care provider about  hydration, which may include: Up to 2 hours before the procedure - you may continue to drink clear liquids, such as water, clear fruit juice, black coffee, and plain tea. Eating and drinking restrictions  Follow instructions from your health care provider about eating and drinking, which may include: 8 hours before the procedure - stop eating heavy meals or foods such as meat, fried foods, or fatty foods. 6 hours before the procedure - stop eating light meals or foods, such as toast or cereal. 6 hours before the procedure - stop drinking milk or drinks that contain milk. 2 hours before the procedure - stop drinking clear liquids. Medicine   Ask your health care provider about: Changing or stopping your regular medicines. This is especially important if you are taking diabetes medicines or blood thinners. Taking medicines such as aspirin and ibuprofen. These medicines can thin your blood. Do not take these medicines before your procedure if your health care provider instructs you not to. Tests and exams  You will have a physical exam. You may have blood tests done to show: How well your kidneys and liver are working. How well your blood can clot. General instructions  Plan to have someone take you home from the hospital or clinic. If you will be going home right after the procedure, plan to have someone with you for 24 hours. What happens during the procedure? An IV tube will be inserted into one of your veins. Medicine to help you relax (sedative) will be given through the IV tube. The medical or dental procedure will be performed. What happens after the procedure? Your blood pressure, heart rate, breathing rate, and blood oxygen level will be monitored often until the medicines you were given have worn off. Do not drive for 24 hours. This information is not intended to replace advice given to you by your health care provider. Make sure you discuss any questions you have with your health  care provider. Document Released: 12/01/2000 Document Revised: 08/12/2015 Document Reviewed: 06/28/2015 Elsevier Interactive Patient Education  2017 [employer] Inc.   Full Liquid Diet A full liquid diet may be used:  To help you transition from a clear liquid diet to a soft diet.  When your body is healing and can only tolerate foods that are easy to digest.  Before or after certain a procedure, test, or surgery (such as stomach or intestinal surgeries).  If you have trouble swallowing or chewing. A full liquid diet includes fluids and foods that are liquid or will become liquid at room temperature. The full liquid diet gives you the proteins, fluids, salts, and minerals that you need for energy. If you continue this diet for more than 72 hours, talk to your health care provider about how many calories you need to consume. If you continue the diet for more than 5 days, talk to your health care provider about taking a multivitamin or a nutritional supplement. What do I need to know about a full liquid diet?  You may have any liquid.  You may have any food that becomes a liquid at room temperature. The food is considered a liquid if it can be poured off a spoon at room temperature.  Drink one serving of citrus or vitamin C-enriched fruit juice daily. What foods can I eat? Grains  Any grain food that can be pureed in soup (such as crackers, pasta, and rice). Hot cereal (such as farina or oatmeal) that has been blended. Talk to your health care provider or dietitian about these foods. Vegetables  Pulp-free tomato or vegetable juice. Vegetables pureed in soup. Fruits  Fruit juice, including nectars and juices with pulp. Meats and Other Protein Sources  Eggs in custard, eggnog mix, and eggs used in ice cream or pudding. Strained meats, like in baby food, may be allowed. Consult your health care provider. Dairy  Milk and milk-based beverages, including milk shakes and instant breakfast  mixes. Smooth yogurt. Pureed cottage cheese. Avoid these foods if they are not well tolerated. Beverages  All beverages, including liquid nutritional supplements. Ask your health care provider if you can have carbonated beverages. They may not be well tolerated. Condiments  Iodized salt, pepper, spices, and flavorings. Cocoa powder. Vinegar, ketchup, yellow mustard, smooth sauces (such as hollandaise, cheese sauce, or white sauce), and soy sauce. Sweets and Desserts  Custard, smooth pudding. Flavored gelatin. Tapioca, junket. Plain ice cream, sherbet, fruit ices. Frozen ice pops, frozen fudge pops, pudding pops, and other frozen bars with cream. Syrups, including chocolate syrup. Sugar, honey, jelly. Fats and Oils  Margarine, butter, cream, sour cream, and oils. Other  Broth and cream soups. Strained, broth-based soups. The items listed above may not be a complete list of recommended foods or beverages. Contact your dietitian for more options.  What foods can I not eat? Grains  All breads. Grains are not allowed unless they are pureed into soup. Vegetables  Vegetables are not allowed unless they are juiced, or cooked and pureed into soup. Fruits  Fruits are not allowed unless they are juiced. Meats and Other Protein Sources  Any meat or fish. Cooked or raw eggs. Nut butters. Dairy  Cheese. Condiments  Stone ground mustards. Fats and Oils  Fats that are coarse or chunky. Sweets and Desserts  Ice cream or other frozen desserts that have any solids in them or on top, such as nuts, chocolate chips, and pieces of cookies. Cakes. Cookies. Candy. Others  Soups with chunks or pieces in them. The items listed above may not be a complete list of foods and beverages to avoid. Contact your dietitian for more information.  This information is not intended to replace advice given to you by your health care provider. Make sure you discuss any questions you have with your health care  provider. Document Released: 03/08/2005 Document Revised: 08/14/2015 Document Reviewed: 01/11/2013 Elsevier Interactive Patient Education  2017 [employer].
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
## 2016-06-16 NOTE — Op Note (Signed)
[facility] Patient Name: Shawn Ward Procedure Date: 06/16/2016 11:12 AM MRN: [id number] Date of Birth: [birth year] Attending MD: Hildred Laser , MD CSN: [id number] Age: 56 Admit Type: Outpatient Procedure:                Upper GI endoscopy Indications:              Follow-up of esophageal varices, For therapy of                            esophageal varices Providers:                Hildred Laser, MD, Gwynneth Albright RN, RN,                            Aram Candela Referring MD:             Tammi Sou, MD Medicines:                Lidocaine spray, Meperidine 50 mg IV, Midazolam 8                            mg IV Complications:            No immediate complications. Estimated Blood Loss:     Estimated blood loss: none. Procedure:                Pre-Anesthesia Assessment:                           - Prior to the procedure, a History and Physical                            was performed, and patient medications and                            allergies were reviewed. The patient's tolerance of                            previous anesthesia was also reviewed. The risks                            and benefits of the procedure and the sedation                            options and risks were discussed with the patient.                            All questions were answered, and informed consent                            was obtained. Prior Anticoagulants: The patient has                            taken no previous anticoagulant or antiplatelet  agents. ASA Grade Assessment: III - A patient with                            severe systemic disease. After reviewing the risks                            and benefits, the patient was deemed in                            satisfactory condition to undergo the procedure.                           After obtaining informed consent, the endoscope was                            passed under direct vision.  Throughout the                            procedure, the patient's blood pressure, pulse, and                            oxygen saturations were monitored continuously. The                            [id number] ([id number]) scope was introduced through the                            mouth, and advanced to the second part of duodenum.                            The upper GI endoscopy was accomplished without                            difficulty. The patient tolerated the procedure                            well. Scope In: 12:08:09 PM Scope Out: 12:19:24 PM Total Procedure Duration: 0 hours 11 minutes 15 seconds  Findings:      The proximal esophagus was normal.      Grade III varices were found in the mid esophagus. Two bands were       successfully placed with incomplete eradication of varices. There was no       bleeding during, and at the end, of the procedure.      A post variceal banding scar was found in the distal esophagus.      The Z-line was regular and was found 40 cm from the incisors.      Moderate portal hypertensive gastropathy was found in the entire       examined stomach.      Diffuse mild mucosal changes characterized by congestion and erythema       were found in the duodenal bulb and in the second portion of the       duodenum. Impression:               - Normal proximal esophagus.                           -  Grade III esophageal varices; 2 columns; both                            colums banded.                           - One column was grade 2 located next to grade 3                            varix annot banded.                           - Scar in the distal esophagus from previous                            banding.                           - Z-line regular, 40 cm from the incisors.                           - Portal hypertensive gastropathy.                           - Portal hypertensive duodenopathy.                           - No specimens  collected. Moderate Sedation:      Moderate (conscious) sedation was administered by the endoscopy nurse       and supervised by the endoscopist. The following parameters were       monitored: oxygen saturation, heart rate, blood pressure, CO2       capnography and response to care. Total physician intraservice time was       17 minutes. Recommendation:           - Patient has a contact number available for                            emergencies. The signs and symptoms of potential                            delayed complications were discussed with the                            patient. Return to normal activities tomorrow.                            Written discharge instructions were provided to the                            patient.                           - Full liquid diet today.                           - Resume previous  diet starting tomorrow morning.                           - Continue present medications.                           - Return to GI clinic in 3 months. Procedure Code(s):        --- Professional ---                           [phone], Esophagogastroduodenoscopy, flexible,                            transoral; with band ligation of esophageal/gastric                            varices                           99152, Moderate sedation services provided by the                            same physician or other qualified health care                            professional performing the diagnostic or                            therapeutic service that the sedation supports,                            requiring the presence of an independent trained                            observer to assist in the monitoring of the                            patient's level of consciousness and physiological                            status; initial 15 minutes of intraservice time,                            patient age 7 years or older Diagnosis Code(s):        ---  Professional ---                           I85.00, Esophageal varices without bleeding                           K22.8, Other specified diseases of esophagus                           K76.6, Portal hypertension  K31.89, Other diseases of stomach and duodenum CPT copyright 2016 [employer]. All rights reserved. The codes documented in this report are preliminary and upon coder review may  be revised to meet current compliance requirements. Hildred Laser, MD Hildred Laser, MD 06/16/2016 12:33:58 PM This report has been signed electronically. Number of Addenda: 0

## 2016-06-17 ENCOUNTER — Encounter (INDEPENDENT_AMBULATORY_CARE_PROVIDER_SITE_OTHER): Payer: Self-pay | Admitting: *Deleted

## 2016-06-23 ENCOUNTER — Encounter (HOSPITAL_COMMUNITY): Payer: Self-pay | Admitting: Internal Medicine

## 2016-08-31 ENCOUNTER — Other Ambulatory Visit (INDEPENDENT_AMBULATORY_CARE_PROVIDER_SITE_OTHER): Payer: Self-pay | Admitting: Internal Medicine

## 2016-09-28 ENCOUNTER — Encounter (INDEPENDENT_AMBULATORY_CARE_PROVIDER_SITE_OTHER): Payer: Self-pay | Admitting: *Deleted

## 2016-09-28 ENCOUNTER — Other Ambulatory Visit (INDEPENDENT_AMBULATORY_CARE_PROVIDER_SITE_OTHER): Payer: Self-pay | Admitting: Internal Medicine

## 2016-09-28 ENCOUNTER — Ambulatory Visit (INDEPENDENT_AMBULATORY_CARE_PROVIDER_SITE_OTHER): Payer: BLUE CROSS/BLUE SHIELD | Admitting: Internal Medicine

## 2016-09-28 ENCOUNTER — Encounter (INDEPENDENT_AMBULATORY_CARE_PROVIDER_SITE_OTHER): Payer: Self-pay | Admitting: Internal Medicine

## 2016-09-28 VITALS — BP 140/90 | HR 60 | Temp 98.2°F | Ht 70.0 in | Wt 273.6 lb

## 2016-09-28 DIAGNOSIS — I8500 Esophageal varices without bleeding: Secondary | ICD-10-CM

## 2016-09-28 DIAGNOSIS — K7460 Unspecified cirrhosis of liver: Secondary | ICD-10-CM
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
## 2016-09-28 NOTE — Patient Instructions (Addendum)
Banding in 6 weeks.

## 2016-10-01 ENCOUNTER — Encounter (INDEPENDENT_AMBULATORY_CARE_PROVIDER_SITE_OTHER): Payer: Self-pay

## 2017-02-02 ENCOUNTER — Ambulatory Visit (HOSPITAL_COMMUNITY)
Admission: RE | Admit: 2017-02-02 | Discharge: 2017-02-02 | Disposition: A | Discharge status: Home / Self Care | Payer: BLUE CROSS/BLUE SHIELD | Source: Ambulatory Visit | Attending: Internal Medicine | Admitting: Internal Medicine

## 2017-02-02 ENCOUNTER — Other Ambulatory Visit: Payer: Self-pay

## 2017-02-02 ENCOUNTER — Encounter (HOSPITAL_COMMUNITY)
Admission: RE | Disposition: A | Discharge status: Home / Self Care | Payer: Self-pay | Source: Ambulatory Visit | Attending: Internal Medicine

## 2017-02-02 ENCOUNTER — Encounter (HOSPITAL_COMMUNITY): Payer: Self-pay | Admitting: *Deleted

## 2017-02-02 DIAGNOSIS — Z09 Encounter for follow-up examination after completed treatment for conditions other than malignant neoplasm: Secondary | ICD-10-CM | POA: Diagnosis not present

## 2017-02-02 DIAGNOSIS — E669 Obesity, unspecified: Secondary | ICD-10-CM | POA: Insufficient documentation

## 2017-02-02 DIAGNOSIS — I872 Venous insufficiency (chronic) (peripheral): Secondary | ICD-10-CM | POA: Insufficient documentation

## 2017-02-02 DIAGNOSIS — Z7984 Long term (current) use of oral hypoglycemic drugs: Secondary | ICD-10-CM | POA: Diagnosis not present

## 2017-02-02 DIAGNOSIS — E119 Type 2 diabetes mellitus without complications: Secondary | ICD-10-CM | POA: Diagnosis not present

## 2017-02-02 DIAGNOSIS — K7460 Unspecified cirrhosis of liver: Secondary | ICD-10-CM | POA: Diagnosis not present

## 2017-02-02 DIAGNOSIS — I8500 Esophageal varices without bleeding: Secondary | ICD-10-CM | POA: Diagnosis not present

## 2017-02-02 DIAGNOSIS — Z79899 Other long term (current) drug therapy: Secondary | ICD-10-CM | POA: Insufficient documentation

## 2017-02-02 DIAGNOSIS — K3189 Other diseases of stomach and duodenum: Secondary | ICD-10-CM | POA: Diagnosis not present

## 2017-02-02 DIAGNOSIS — Z791 Long term (current) use of non-steroidal anti-inflammatories (NSAID): Secondary | ICD-10-CM | POA: Insufficient documentation

## 2017-02-02 DIAGNOSIS — K766 Portal hypertension: Secondary | ICD-10-CM | POA: Insufficient documentation

## 2017-02-02 DIAGNOSIS — K317 Polyp of stomach and duodenum: Secondary | ICD-10-CM | POA: Diagnosis not present

## 2017-02-02 DIAGNOSIS — K228 Other specified diseases of esophagus: Secondary | ICD-10-CM | POA: Diagnosis not present

## 2017-02-02 DIAGNOSIS — E785 Hyperlipidemia, unspecified: Secondary | ICD-10-CM | POA: Diagnosis not present

## 2017-02-02 DIAGNOSIS — K7581 Nonalcoholic steatohepatitis (NASH): Secondary | ICD-10-CM | POA: Diagnosis not present

## 2017-02-02 HISTORY — PX: ESOPHAGOGASTRODUODENOSCOPY: SHX5428

## 2017-02-02 LAB — GLUCOSE, CAPILLARY: Glucose-Capillary: 111 mg/dL — ABNORMAL HIGH (ref 65–99)

## 2017-02-02 SURGERY — EGD (ESOPHAGOGASTRODUODENOSCOPY)
Anesthesia: Moderate Sedation

## 2017-02-02 MED ORDER — SODIUM CHLORIDE 0.9 % IV SOLN
INTRAVENOUS | Status: DC
  Administered 2017-02-02: 14:00:00 via INTRAVENOUS

## 2017-02-02 MED ORDER — MIDAZOLAM HCL 5 MG/5ML IJ SOLN
INTRAMUSCULAR | Status: DC | PRN
  Administered 2017-02-02 (×3): 2 mg via INTRAVENOUS

## 2017-02-02 MED ORDER — LIDOCAINE VISCOUS 2 % MT SOLN
OROMUCOSAL | Status: DC | PRN
  Administered 2017-02-02: 1 via OROMUCOSAL

## 2017-02-02 MED ORDER — MEPERIDINE HCL 50 MG/ML IJ SOLN
INTRAMUSCULAR | Status: DC | PRN
  Administered 2017-02-02 (×2): 25 mg via INTRAVENOUS

## 2017-02-02 MED ORDER — MIDAZOLAM HCL 5 MG/5ML IJ SOLN
INTRAMUSCULAR | Status: AC
  Filled 2017-02-02: qty 10

## 2017-02-02 MED ORDER — MEPERIDINE HCL 50 MG/ML IJ SOLN
INTRAMUSCULAR | Status: AC
  Filled 2017-02-02: qty 1

## 2017-02-02 MED ORDER — LIDOCAINE VISCOUS 2 % MT SOLN
OROMUCOSAL | Status: AC
  Filled 2017-02-02: qty 15
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
## 2017-02-02 NOTE — Op Note (Signed)
[facility] Patient Name: Shawn Ward Procedure Date: 02/02/2017 2:04 PM MRN: [id number] Date of Birth: 1960/09/22 Attending MD: Hildred Laser , MD CSN: [id number] Age: 56 Admit Type: Outpatient Procedure:                Upper GI endoscopy Indications:              Follow-up of esophageal varices, For therapy of                            esophageal varices Providers:                Hildred Laser, MD, Janeece Riggers, RN, Randa Spike,                            Technician Referring MD:             Barbaraann Rondo, MD Medicines:                Lidocaine spray, Meperidine 50 mg IV, Midazolam 6                            mg IV Complications:            No immediate complications. Estimated Blood Loss:     Estimated blood loss: none. Procedure:                Pre-Anesthesia Assessment:                           - Prior to the procedure, a History and Physical                            was performed, and patient medications and                            allergies were reviewed. The patient's tolerance of                            previous anesthesia was also reviewed. The risks                            and benefits of the procedure and the sedation                            options and risks were discussed with the patient.                            All questions were answered, and informed consent                            was obtained. Prior Anticoagulants: The patient has                            taken no previous anticoagulant or antiplatelet  agents. ASA Grade Assessment: III - A patient with                            severe systemic disease. After reviewing the risks                            and benefits, the patient was deemed in                            satisfactory condition to undergo the procedure.                           After obtaining informed consent, the endoscope was                            passed under direct vision.  Throughout the                            procedure, the patient's blood pressure, pulse, and                            oxygen saturations were monitored continuously. The                            EG29-[id number] ([id number]) scope was introduced through the                            mouth, and advanced to the second part of duodenum.                            The upper GI endoscopy was accomplished without                            difficulty. The patient tolerated the procedure                            well. The upper GI endoscopy was accomplished                            without difficulty. The patient tolerated the                            procedure well. Scope In: 2:27:17 PM Scope Out: 2:32:35 PM Total Procedure Duration: 0 hours 5 minutes 18 seconds  Findings:      The proximal esophagus was normal.      Grade I varices were found in the middle third of the esophagus.      A post variceal banding scar was found in the distal esophagus.      The Z-line was regular and was found 40 cm from the incisors.      Moderate portal hypertensive gastropathy was found in the entire       examined stomach.      A few small sessile polyps were found in the gastric body.      Diffuse  mildly congested mucosa without active bleeding and with no       stigmata of bleeding was found in the duodenal bulb and in the second       portion of the duodenum. Impression:               - Normal proximal esophagus.                           - Grade I esophageal varices.                           - Scar in the distal esophagus.                           - Z-line regular, 40 cm from the incisors.                           - Portal hypertensive gastropathy.                           - A few gastric polyps.                           - Congested duodenal mucosa.                           - No specimens collected. Moderate Sedation:      Moderate (conscious) sedation was administered by the endoscopy nurse        and supervised by the endoscopist. The following parameters were       monitored: oxygen saturation, heart rate, blood pressure, CO2       capnography and response to care. Total physician intraservice time was       12 minutes. Recommendation:           - Patient has a contact number available for                            emergencies. The signs and symptoms of potential                            delayed complications were discussed with the                            patient. Return to normal activities tomorrow.                            Written discharge instructions were provided to the                            patient.                           - Resume previous diet today.                           - Continue present medications.                           -  Repeat upper endoscopy in 6 months.                           - AFP Procedure Code(s):        --- Professional ---                           [phone], Esophagogastroduodenoscopy, flexible,                            transoral; diagnostic, including collection of                            specimen(s) by brushing or washing, when performed                            (separate procedure)                           99152, Moderate sedation services provided by the                            same physician or other qualified health care                            professional performing the diagnostic or                            therapeutic service that the sedation supports,                            requiring the presence of an independent trained                            observer to assist in the monitoring of the                            patient's level of consciousness and physiological                            status; initial 15 minutes of intraservice time,                            patient age 49 years or older Diagnosis Code(s):        --- Professional ---                           I85.00, Esophageal varices  without bleeding                           K22.8, Other specified diseases of esophagus                           K76.6, Portal hypertension  K31.89, Other diseases of stomach and duodenum                           K31.7, Polyp of stomach and duodenum CPT copyright 2016 American Medical Association. All rights reserved. The codes documented in this report are preliminary and upon coder review may  be revised to meet current compliance requirements. Hildred Laser, MD Hildred Laser, MD 02/02/2017 2:51:02 PM This report has been signed electronically. Number of Addenda: 0

## 2017-02-02 NOTE — Discharge Instructions (Signed)
Resume usual medications and diet. No driving for 24 hours. Physician will call with results of blood test(AFP). Please have your primary care physician schedule a liver ultrasound for screening. Next EGD in 6 months.      Esophagogastroduodenoscopy, Care After Refer to this sheet in the next few weeks. These instructions provide you with information about caring for yourself after your procedure. Your health care provider may also give you more specific instructions. Your treatment has been planned according to current medical practices, but problems sometimes occur. Call your health care provider if you have any problems or questions after your procedure. What can I expect after the procedure? After the procedure, it is common to have:  A sore throat.  Nausea.  Bloating.  Dizziness.  Fatigue.  Follow these instructions at home:  Do not eat or drink anything until the numbing medicine (local anesthetic) has worn off and your gag reflex has returned. You will know that the local anesthetic has worn off when you can swallow comfortably.  Do not drive for 24 hours if you received a medicine to help you relax (sedative).  If your health care provider took a tissue sample for testing during the procedure, make sure to get your test results. This is your responsibility. Ask your health care provider or the department performing the test when your results will be ready.  Keep all follow-up visits as told by your health care provider. This is important. Contact a health care provider if:  You cannot stop coughing.  You are not urinating.  You are urinating less than usual. Get help right away if:  You have trouble swallowing.  You cannot eat or drink.  You have throat or chest pain that gets worse.  You are dizzy or light-headed.  You faint.  You have nausea or vomiting.  You have chills.  You have a fever.  You have severe abdominal pain.  You have black, tarry,  or bloody stools. This information is not intended to replace advice given to you by your health care provider. Make sure you discuss any questions you have with your health care provider. Document Released: 02/23/2012 Document Revised: 08/14/2015 Document Reviewed: 01/30/2015 Elsevier Interactive Patient Education  [employer].

## 2017-02-03 LAB — AFP TUMOR MARKER: AFP, Serum, Tumor Marker: 1.7 ng/mL (ref 0.0–8.3)

## 2017-02-04 ENCOUNTER — Encounter (HOSPITAL_COMMUNITY): Payer: Self-pay | Admitting: Internal Medicine

## 2017-03-04 ENCOUNTER — Encounter: Payer: Self-pay | Admitting: Internal Medicine

## 2017-04-25 ENCOUNTER — Other Ambulatory Visit (INDEPENDENT_AMBULATORY_CARE_PROVIDER_SITE_OTHER): Payer: Self-pay | Admitting: *Deleted

## 2017-04-25 DIAGNOSIS — I8501 Esophageal varices with bleeding: Secondary | ICD-10-CM | POA: Insufficient documentation

## 2017-05-08 ENCOUNTER — Other Ambulatory Visit (INDEPENDENT_AMBULATORY_CARE_PROVIDER_SITE_OTHER): Payer: Self-pay | Admitting: Internal Medicine

## 2017-07-18 ENCOUNTER — Encounter: Payer: Self-pay | Admitting: Internal Medicine

## 2017-07-19 ENCOUNTER — Encounter (INDEPENDENT_AMBULATORY_CARE_PROVIDER_SITE_OTHER): Payer: Self-pay | Admitting: *Deleted

## 2017-08-08 ENCOUNTER — Telehealth (INDEPENDENT_AMBULATORY_CARE_PROVIDER_SITE_OTHER): Payer: Self-pay | Admitting: *Deleted

## 2017-08-08 NOTE — Telephone Encounter (Signed)
agree

## 2017-08-08 NOTE — Telephone Encounter (Signed)
Referring MD/PCP: tucker   Procedure: egd  Reason/Indication:  Esophageal varices  Has patient had this procedure before?  Yes, 01/2017   If so, when, by whom and where?    Is there a family history of colon cancer?    Who?  What age when diagnosed?    Is patient diabetic?   yes      Does patient have prosthetic heart valve or mechanical valve?  no  Do you have a pacemaker?  no  Has patient ever had endocarditis? no  Has patient had joint replacement within last 12 months?  no  Is patient constipated or do they take laxatives? no  Does patient have a history of alcohol/drug use?  no  Is patient on blood thinner such as Coumadin, Plavix and/or Aspirin? no  Medications: see epic  Allergies: nkda  Medication Adjustment per Dr Lindi Adie, NP:   Procedure date & time: 09/07/17 at 930

## 2017-09-01 ENCOUNTER — Other Ambulatory Visit (INDEPENDENT_AMBULATORY_CARE_PROVIDER_SITE_OTHER): Payer: Self-pay | Admitting: Internal Medicine

## 2017-09-07 ENCOUNTER — Other Ambulatory Visit (INDEPENDENT_AMBULATORY_CARE_PROVIDER_SITE_OTHER): Payer: Self-pay | Admitting: *Deleted

## 2017-09-07 ENCOUNTER — Encounter (HOSPITAL_COMMUNITY): Payer: Self-pay | Admitting: *Deleted

## 2017-09-07 ENCOUNTER — Telehealth (INDEPENDENT_AMBULATORY_CARE_PROVIDER_SITE_OTHER): Payer: Self-pay | Admitting: *Deleted

## 2017-09-07 ENCOUNTER — Other Ambulatory Visit: Payer: Self-pay

## 2017-09-07 ENCOUNTER — Encounter (HOSPITAL_COMMUNITY)
Admission: RE | Disposition: A | Discharge status: Home / Self Care | Payer: Self-pay | Source: Ambulatory Visit | Attending: Internal Medicine

## 2017-09-07 ENCOUNTER — Ambulatory Visit (HOSPITAL_COMMUNITY)
Admission: RE | Admit: 2017-09-07 | Discharge: 2017-09-07 | Disposition: A | Discharge status: Home / Self Care | Payer: BLUE CROSS/BLUE SHIELD | Source: Ambulatory Visit | Attending: Internal Medicine | Admitting: Internal Medicine

## 2017-09-07 DIAGNOSIS — E119 Type 2 diabetes mellitus without complications: Secondary | ICD-10-CM | POA: Diagnosis not present

## 2017-09-07 DIAGNOSIS — I8500 Esophageal varices without bleeding: Secondary | ICD-10-CM | POA: Diagnosis not present

## 2017-09-07 DIAGNOSIS — X58XXXA Exposure to other specified factors, initial encounter: Secondary | ICD-10-CM | POA: Insufficient documentation

## 2017-09-07 DIAGNOSIS — K228 Other specified diseases of esophagus: Secondary | ICD-10-CM | POA: Diagnosis not present

## 2017-09-07 DIAGNOSIS — K7460 Unspecified cirrhosis of liver: Secondary | ICD-10-CM | POA: Diagnosis not present

## 2017-09-07 DIAGNOSIS — K7581 Nonalcoholic steatohepatitis (NASH): Secondary | ICD-10-CM | POA: Insufficient documentation

## 2017-09-07 DIAGNOSIS — Z79899 Other long term (current) drug therapy: Secondary | ICD-10-CM | POA: Insufficient documentation

## 2017-09-07 DIAGNOSIS — I8501 Esophageal varices with bleeding: Secondary | ICD-10-CM | POA: Insufficient documentation

## 2017-09-07 DIAGNOSIS — Z7984 Long term (current) use of oral hypoglycemic drugs: Secondary | ICD-10-CM | POA: Insufficient documentation

## 2017-09-07 DIAGNOSIS — T182XXA Foreign body in stomach, initial encounter: Secondary | ICD-10-CM | POA: Diagnosis not present

## 2017-09-07 HISTORY — PX: ESOPHAGEAL BANDING: SHX5518

## 2017-09-07 HISTORY — PX: ESOPHAGOGASTRODUODENOSCOPY: SHX5428

## 2017-09-07 LAB — BILIRUBIN, DIRECT: BILIRUBIN DIRECT: 0.6 mg/dL — AB (ref 0.1–0.5)

## 2017-09-07 LAB — BILIRUBIN, TOTAL: Total Bilirubin: 3.8 mg/dL — ABNORMAL HIGH (ref 0.3–1.2)

## 2017-09-07 LAB — CREATININE, SERUM
CREATININE: 0.61 mg/dL (ref 0.61–1.24)
GFR calc non Af Amer: >60 mL/min (ref >60)

## 2017-09-07 LAB — GLUCOSE, CAPILLARY: Glucose-Capillary: 136 mg/dL — ABNORMAL HIGH (ref 65–99)

## 2017-09-07 LAB — PROTIME-INR
INR: 1.43
Prothrombin Time: 17.4 seconds — ABNORMAL HIGH (ref 11.4–15.2)

## 2017-09-07 LAB — AMMONIA: AMMONIA: 91 umol/L — AB (ref 9–35)

## 2017-09-07 SURGERY — EGD (ESOPHAGOGASTRODUODENOSCOPY)
Anesthesia: Moderate Sedation

## 2017-09-07 MED ORDER — LIDOCAINE VISCOUS HCL 2 % MT SOLN
OROMUCOSAL | Status: DC | PRN
  Administered 2017-09-07: 4 mL via OROMUCOSAL

## 2017-09-07 MED ORDER — MIDAZOLAM HCL 5 MG/5ML IJ SOLN
INTRAMUSCULAR | Status: AC
  Filled 2017-09-07: qty 10

## 2017-09-07 MED ORDER — METOCLOPRAMIDE HCL 5 MG PO TABS: 5.0000 mg | ORAL_TABLET | Freq: Three times a day (TID) | ORAL | 1 refills | Status: DC

## 2017-09-07 MED ORDER — MIDAZOLAM HCL 5 MG/5ML IJ SOLN
INTRAMUSCULAR | Status: DC | PRN
  Administered 2017-09-07: 1 mg via INTRAVENOUS
  Administered 2017-09-07: 2 mg via INTRAVENOUS
  Administered 2017-09-07 (×2): 1 mg via INTRAVENOUS
  Administered 2017-09-07: 2 mg via INTRAVENOUS

## 2017-09-07 MED ORDER — LACTULOSE 20 GM/30ML PO SOLN: 30.0000 mL | Freq: Two times a day (BID) | ORAL | 1 refills | Status: DC

## 2017-09-07 MED ORDER — SODIUM CHLORIDE 0.9 % IV SOLN
INTRAVENOUS | Status: DC
  Administered 2017-09-07: 1000 mL via INTRAVENOUS

## 2017-09-07 MED ORDER — MEPERIDINE HCL 50 MG/ML IJ SOLN
INTRAMUSCULAR | Status: AC
  Filled 2017-09-07: qty 1

## 2017-09-07 MED ORDER — STERILE WATER FOR IRRIGATION IR SOLN
Status: DC | PRN
  Administered 2017-09-07: 09:00:00

## 2017-09-07 MED ORDER — MEPERIDINE HCL 50 MG/ML IJ SOLN
INTRAMUSCULAR | Status: DC | PRN
  Administered 2017-09-07 (×2): 25 mg via INTRAVENOUS

## 2017-09-07 MED ORDER — RIFAXIMIN 550 MG PO TABS: 550.0000 mg | ORAL_TABLET | Freq: Two times a day (BID) | ORAL | 11 refills | Status: DC

## 2017-09-07 MED ORDER — LIDOCAINE VISCOUS HCL 2 % MT SOLN
OROMUCOSAL | Status: AC
  Filled 2017-09-07: qty 15

## 2017-09-07 NOTE — Op Note (Signed)
[facility] Patient Name: Shawn Ward Procedure Date: 09/07/2017 9:17 AM MRN: [id number] Date of Birth: 09-Sep-1960 Attending MD: Hildred Laser , MD CSN: [id number] Age: 57 Admit Type: Outpatient Procedure:                Upper GI endoscopy Indications:              Esophageal varices, Follow-up of esophageal                            varices, For therapy of esophageal varices Providers:                Hildred Laser, MD, Otis Peak B. Sharon Seller, RN, Nelma Rothman, Technician Referring MD:             Malachi Paradise, NP Medicines:                Lidocaine spray, Meperidine 50 mg IV, Midazolam 7                            mg IV Complications:            No immediate complications. Estimated Blood Loss:     Estimated blood loss was minimal. Procedure:                Pre-Anesthesia Assessment:                           - Prior to the procedure, a History and Physical                            was performed, and patient medications and                            allergies were reviewed. The patient's tolerance of                            previous anesthesia was also reviewed. The risks                            and benefits of the procedure and the sedation                            options and risks were discussed with the patient.                            All questions were answered, and informed consent                            was obtained. Prior Anticoagulants: The patient has                            taken no previous anticoagulant or antiplatelet  agents. ASA Grade Assessment: III - A patient with                            severe systemic disease. After reviewing the risks                            and benefits, the patient was deemed in                            satisfactory condition to undergo the procedure.                           After obtaining informed consent, the endoscope was                            passed  under direct vision. Throughout the                            procedure, the patient's blood pressure, pulse, and                            oxygen saturations were monitored continuously. The                            [id number] ([id number]) scope was introduced through the                            mouth, and advanced to the second part of duodenum.                            The upper GI endoscopy was accomplished without                            difficulty. The patient tolerated the procedure                            well. Scope In: 9:33:46 AM Scope Out: 9:47:21 AM Total Procedure Duration: 0 hours 13 minutes 35 seconds  Findings:      Grade III varices were found in the mid esophagus. One band was       successfully placed with complete eradication, resulting in deflation of       varices. Bleeding had stopped at the end of the maneuver.      A post variceal banding scar was found in the distal esophagus.      The Z-line was regular and was found 40 cm from the incisors.      Food was found in the gastric fundus and in the gastric body.      The cardia and pylorus were normal.      The duodenal bulb and second portion of the duodenum were normal. Impression:               - Grade III esophageal varices. Completely  eradicated. Banded.                           - Scar in the distal esophagus.                           - Z-line regular, 40 cm from the incisors.                           - Food was found in the stomach.                           - Normal cardia and pylorus.                           - Normal duodenal bulb and second portion of the                            duodenum.                           - No specimens collected. Moderate Sedation:      Moderate (conscious) sedation was administered by the endoscopy nurse       and supervised by the endoscopist. The following parameters were       monitored: oxygen saturation, heart rate, blood  pressure, CO2       capnography and response to care. Total physician intraservice time was       19 minutes. Recommendation:           - Patient has a contact number available for                            emergencies. The signs and symptoms of potential                            delayed complications were discussed with the                            patient. Return to normal activities tomorrow.                            Written discharge instructions were provided to the                            patient.                           - Clear liquid diet today.                           - - Diabetic (ADA) diet and gastroparesis diet                            tomorrow..                           - Continue present medications.                           -  No aspirin, ibuprofen, naproxen, or other                            non-steroidal anti-inflammatory drugs.                           - Metoclopromide 5 mg po ac.                           - Blood to calculate MELD score.                           - Check AFP today.                           - Repeat upper endoscopy in 3 months. Procedure Code(s):        --- Professional ---                           [phone], Esophagogastroduodenoscopy, flexible,                            transoral; with band ligation of esophageal/gastric                            varices                           G0500, Moderate sedation services provided by the                            same physician or other qualified health care                            professional performing a gastrointestinal                            endoscopic service that sedation supports,                            requiring the presence of an independent trained                            observer to assist in the monitoring of the                            patient's level of consciousness and physiological                            status; initial 15 minutes of intra-service  time;                            patient age 83 years or older (additional time may                            be reported with [id number], as  appropriate) Diagnosis Code(s):        --- Professional ---                           I85.00, Esophageal varices without bleeding                           K22.8, Other specified diseases of esophagus                           T18.2XXA, Foreign body in stomach, initial encounter CPT copyright 2017 American [employer] Association. All rights reserved. The codes documented in this report are preliminary and upon coder review may  be revised to meet current compliance requirements. Hildred Laser, MD Hildred Laser, MD 09/07/2017 10:08:56 AM This report has been signed electronically. Number of Addenda: 0

## 2017-09-07 NOTE — Discharge Instructions (Signed)
No aspirin or NSAIDs. Resume usual medications as before except metformin which can be held today. Metoclopramide 5 mg by mouth 30 minutes before each meal. Clear liquids today. Modified carb gastroparesis diet starting tomorrow. No driving for 24 hours. Physician will call with results of blood test.     Esophagogastroduodenoscopy, Care After Refer to this sheet in the next few weeks. These instructions provide you with information about caring for yourself after your procedure. Your health care provider may also give you more specific instructions. Your treatment has been planned according to current medical practices, but problems sometimes occur. Call your health care provider if you have any problems or questions after your procedure. What can I expect after the procedure? After the procedure, it is common to have:  A sore throat.  Nausea.  Bloating.  Dizziness.  Fatigue.  Follow these instructions at home:  Do not eat or drink anything until the numbing medicine (local anesthetic) has worn off and your gag reflex has returned. You will know that the local anesthetic has worn off when you can swallow comfortably.  Do not drive for 24 hours if you received a medicine to help you relax (sedative).  If your health care provider took a tissue sample for testing during the procedure, make sure to get your test results. This is your responsibility. Ask your health care provider or the department performing the test when your results will be ready.  Keep all follow-up visits as told by your health care provider. This is important. Contact a health care provider if:  You cannot stop coughing.  You are not urinating.  You are urinating less than usual. Get help right away if:  You have trouble swallowing.  You cannot eat or drink.  You have throat or chest pain that gets worse.  You are dizzy or light-headed.  You faint.  You have nausea or vomiting.  You have  chills.  You have a fever.  You have severe abdominal pain.  You have black, tarry, or bloody stools. This information is not intended to replace advice given to you by your health care provider. Make sure you discuss any questions you have with your health care provider. Document Released: 02/23/2012 Document Revised: 08/14/2015 Document Reviewed: 01/30/2015 Elsevier Interactive Patient Education  2018 [employer].     Gastroparesis Gastroparesis, also called delayed gastric emptying, is a condition in which food takes longer than normal to empty from the stomach. The condition is usually long-lasting (chronic). What are the causes? This condition may be caused by:  An endocrine disorder, such as hypothyroidism or diabetes. Diabetes is the most common cause of this condition.  A nervous system disease, such as Parkinson disease or multiple sclerosis.  Cancer, infection, or surgery of the stomach or vagus nerve.  A connective tissue disorder, such as scleroderma.  Certain medicines.  In most cases, the cause is not known. What increases the risk? This condition is more likely to develop in:  People with certain disorders, including endocrine disorders, eating disorders, amyloidosis, and scleroderma.  People with certain diseases, including Parkinson disease or multiple sclerosis.  People with cancer or infection of the stomach or vagus nerve.  People who have had surgery on the stomach or vagus nerve.  People who take certain medicines.  Women.  What are the signs or symptoms? Symptoms of this condition include:  An early feeling of fullness when eating.  Nausea.  Weight loss.  Vomiting.  Heartburn.  Abdominal bloating.  Inconsistent blood glucose levels.  Lack of appetite.  Acid from the stomach coming up into the esophagus (gastroesophageal reflux).  Spasms of the stomach.  Symptoms may come and go. How is this diagnosed? This condition is  diagnosed with tests, such as:  Tests that check how long it takes food to move through the stomach and intestines. These tests include: ? Upper gastrointestinal (GI) series. In this test, X-rays of the intestines are taken after you drink a liquid. The liquid makes the intestines show up better on the X-rays. ? Gastric emptying scintigraphy. In this test, scans are taken after you eat food that contains a small amount of radioactive material. ? Wireless capsule GI monitoring system. This test involves swallowing a capsule that records information about movement through the stomach.  Gastric manometry. This test measures electrical and muscular activity in the stomach. It is done with a thin tube that is passed down the throat and into the stomach.  Endoscopy. This test checks for abnormalities in the lining of the stomach. It is done with a long, thin tube that is passed down the throat and into the stomach.  An ultrasound. This test can help rule out gallbladder disease or pancreatitis as a cause of your symptoms. It uses sound waves to take pictures of the inside of your body.  How is this treated? There is no cure for gastroparesis. This condition may be managed with:  Treatment of the underlying condition causing the gastroparesis.  Lifestyle changes, including exercise and dietary changes. Dietary changes can include: ? Changes in what and when you eat. ? Eating smaller meals more often. ? Eating low-fat foods. ? Eating low-fiber forms of high-fiber foods, such as cooked vegetables instead of raw vegetables. ? Having liquid foods in place of solid foods. Liquid foods are easier to digest.  Medicines. These may be given to control nausea and vomiting and to stimulate stomach muscles.  Getting food through a feeding tube. This may be done in severe cases.  A gastric neurostimulator. This is a device that is inserted into the body with surgery. It helps improve stomach emptying and  control nausea and vomiting.  Follow these instructions at home:  Follow your health care provider's instructions about exercise and diet.  Take medicines only as directed by your health care provider. Contact a health care provider if:  Your symptoms do not improve with treatment.  You have new symptoms. Get help right away if:  You have severe abdominal pain that does not improve with treatment.  You have nausea that does not go away.  You cannot keep fluids down. This information is not intended to replace advice given to you by your health care provider. Make sure you discuss any questions you have with your health care provider. Document Released: 03/08/2005 Document Revised: 08/14/2015 Document Reviewed: 03/04/2014 Elsevier Interactive Patient Education  [employer].
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
## 2017-09-07 NOTE — Telephone Encounter (Signed)
Per Dr.Rehman call in to [facility], Lactulose 30 mL BID with 45monthand Xifaxan 550 mg BID 11 refills. This was called and given to the pharmacist.

## 2017-09-08 LAB — AFP TUMOR MARKER: AFP, SERUM, TUMOR MARKER: 1.2 ng/mL (ref 0.0–8.3)

## 2017-09-12 ENCOUNTER — Encounter (INDEPENDENT_AMBULATORY_CARE_PROVIDER_SITE_OTHER): Payer: Self-pay | Admitting: *Deleted

## 2017-09-12 ENCOUNTER — Encounter (HOSPITAL_COMMUNITY): Payer: Self-pay | Admitting: Internal Medicine

## 2017-09-12 ENCOUNTER — Other Ambulatory Visit (INDEPENDENT_AMBULATORY_CARE_PROVIDER_SITE_OTHER): Payer: Self-pay | Admitting: *Deleted

## 2017-09-12 DIAGNOSIS — K7460 Unspecified cirrhosis of liver: Secondary | ICD-10-CM

## 2017-09-12 DIAGNOSIS — I8510 Secondary esophageal varices without bleeding: Secondary | ICD-10-CM

## 2017-09-12 DIAGNOSIS — K7469 Other cirrhosis of liver: Secondary | ICD-10-CM

## 2017-09-20 ENCOUNTER — Encounter (INDEPENDENT_AMBULATORY_CARE_PROVIDER_SITE_OTHER): Payer: Self-pay | Admitting: *Deleted

## 2017-09-26 ENCOUNTER — Encounter (INDEPENDENT_AMBULATORY_CARE_PROVIDER_SITE_OTHER): Payer: Self-pay | Admitting: Internal Medicine

## 2017-11-30 ENCOUNTER — Other Ambulatory Visit (INDEPENDENT_AMBULATORY_CARE_PROVIDER_SITE_OTHER): Payer: Self-pay | Admitting: Internal Medicine

## 2017-12-19 ENCOUNTER — Other Ambulatory Visit (INDEPENDENT_AMBULATORY_CARE_PROVIDER_SITE_OTHER): Payer: Self-pay | Admitting: Internal Medicine

## 2017-12-19 DIAGNOSIS — I8510 Secondary esophageal varices without bleeding: Secondary | ICD-10-CM

## 2017-12-19 DIAGNOSIS — K7460 Unspecified cirrhosis of liver: Principal | ICD-10-CM

## 2017-12-21 ENCOUNTER — Encounter (HOSPITAL_COMMUNITY)
Admission: RE | Disposition: A | Discharge status: Home / Self Care | Payer: Self-pay | Source: Ambulatory Visit | Attending: Internal Medicine

## 2017-12-21 ENCOUNTER — Other Ambulatory Visit: Payer: Self-pay

## 2017-12-21 ENCOUNTER — Encounter (HOSPITAL_COMMUNITY): Payer: Self-pay

## 2017-12-21 ENCOUNTER — Ambulatory Visit (HOSPITAL_COMMUNITY)
Admission: RE | Admit: 2017-12-21 | Discharge: 2017-12-21 | Disposition: A | Discharge status: Home / Self Care | Payer: BLUE CROSS/BLUE SHIELD | Source: Ambulatory Visit | Attending: Internal Medicine | Admitting: Internal Medicine

## 2017-12-21 ENCOUNTER — Ambulatory Visit (HOSPITAL_COMMUNITY): Admit: 2017-12-21 | Payer: BLUE CROSS/BLUE SHIELD | Admitting: Internal Medicine

## 2017-12-21 ENCOUNTER — Encounter (HOSPITAL_COMMUNITY): Payer: Self-pay | Admitting: *Deleted

## 2017-12-21 DIAGNOSIS — I8510 Secondary esophageal varices without bleeding: Secondary | ICD-10-CM | POA: Insufficient documentation

## 2017-12-21 DIAGNOSIS — K7581 Nonalcoholic steatohepatitis (NASH): Secondary | ICD-10-CM | POA: Insufficient documentation

## 2017-12-21 DIAGNOSIS — K228 Other specified diseases of esophagus: Secondary | ICD-10-CM | POA: Diagnosis not present

## 2017-12-21 DIAGNOSIS — E669 Obesity, unspecified: Secondary | ICD-10-CM | POA: Diagnosis not present

## 2017-12-21 DIAGNOSIS — E785 Hyperlipidemia, unspecified: Secondary | ICD-10-CM | POA: Diagnosis not present

## 2017-12-21 DIAGNOSIS — K7460 Unspecified cirrhosis of liver: Secondary | ICD-10-CM | POA: Insufficient documentation

## 2017-12-21 DIAGNOSIS — I872 Venous insufficiency (chronic) (peripheral): Secondary | ICD-10-CM | POA: Insufficient documentation

## 2017-12-21 DIAGNOSIS — Z6836 Body mass index (BMI) 36.0-36.9, adult: Secondary | ICD-10-CM | POA: Diagnosis not present

## 2017-12-21 DIAGNOSIS — E119 Type 2 diabetes mellitus without complications: Secondary | ICD-10-CM | POA: Insufficient documentation

## 2017-12-21 DIAGNOSIS — K766 Portal hypertension: Secondary | ICD-10-CM | POA: Insufficient documentation

## 2017-12-21 DIAGNOSIS — K3189 Other diseases of stomach and duodenum: Secondary | ICD-10-CM

## 2017-12-21 DIAGNOSIS — Z7984 Long term (current) use of oral hypoglycemic drugs: Secondary | ICD-10-CM | POA: Insufficient documentation

## 2017-12-21 DIAGNOSIS — I8500 Esophageal varices without bleeding: Secondary | ICD-10-CM

## 2017-12-21 HISTORY — PX: ESOPHAGOGASTRODUODENOSCOPY: SHX5428

## 2017-12-21 LAB — GLUCOSE, CAPILLARY: GLUCOSE-CAPILLARY: 88 mg/dL (ref 70–99)

## 2017-12-21 SURGERY — EGD (ESOPHAGOGASTRODUODENOSCOPY)
Anesthesia: Moderate Sedation

## 2017-12-21 MED ORDER — STERILE WATER FOR IRRIGATION IR SOLN
Status: DC | PRN
  Administered 2017-12-21: 09:00:00

## 2017-12-21 MED ORDER — LIDOCAINE VISCOUS HCL 2 % MT SOLN
OROMUCOSAL | Status: DC | PRN
  Administered 2017-12-21: 4 mL via OROMUCOSAL

## 2017-12-21 MED ORDER — MEPERIDINE HCL 50 MG/ML IJ SOLN
INTRAMUSCULAR | Status: AC
  Filled 2017-12-21: qty 1

## 2017-12-21 MED ORDER — MEPERIDINE HCL 50 MG/ML IJ SOLN
INTRAMUSCULAR | Status: DC | PRN
  Administered 2017-12-21 (×2): 25 mg via INTRAVENOUS

## 2017-12-21 MED ORDER — MIDAZOLAM HCL 5 MG/5ML IJ SOLN
INTRAMUSCULAR | Status: AC
  Filled 2017-12-21: qty 10

## 2017-12-21 MED ORDER — LIDOCAINE VISCOUS HCL 2 % MT SOLN
OROMUCOSAL | Status: AC
  Filled 2017-12-21: qty 15

## 2017-12-21 MED ORDER — MIDAZOLAM HCL 5 MG/5ML IJ SOLN
INTRAMUSCULAR | Status: DC | PRN
  Administered 2017-12-21 (×2): 2 mg via INTRAVENOUS

## 2017-12-21 MED ORDER — SODIUM CHLORIDE 0.9 % IV SOLN
INTRAVENOUS | Status: DC
  Administered 2017-12-21: 1000 mL via INTRAVENOUS

## 2017-12-21 NOTE — Op Note (Signed)
[facility] Patient Name: Shawn Ward Procedure Date: 12/21/2017 8:19 AM MRN: [id number] Date of Birth: 1960/07/23 Attending MD: Hildred Laser , MD CSN: [id number] Age: 57 Admit Type: Outpatient Procedure:                Upper GI endoscopy Indications:              Follow-up of esophageal varices, For therapy of                            esophageal varices Providers:                Hildred Laser, MD, Otis Peak B. Sharon Seller, RN, Nelma Rothman, Technician Referring MD:             Malachi Paradise, NP Medicines:                Lidocaine spray, Meperidine 50 mg IV, Midazolam 5                            mg IV Complications:            No immediate complications. Estimated Blood Loss:     Estimated blood loss: none. Procedure:                Pre-Anesthesia Assessment:                           - Prior to the procedure, a History and Physical                            was performed, and patient medications and                            allergies were reviewed. The patient's tolerance of                            previous anesthesia was also reviewed. The risks                            and benefits of the procedure and the sedation                            options and risks were discussed with the patient.                            All questions were answered, and informed consent                            was obtained. Prior Anticoagulants: The patient has                            taken no previous anticoagulant or antiplatelet  agents. ASA Grade Assessment: III - A patient with                            severe systemic disease. After reviewing the risks                            and benefits, the patient was deemed in                            satisfactory condition to undergo the procedure.                           After obtaining informed consent, the endoscope was                            passed under direct vision.  Throughout the                            procedure, the patient's blood pressure, pulse, and                            oxygen saturations were monitored continuously. The                            [id number]-H190 ([id number]) scope was introduced through the                            mouth, and advanced to the second part of duodenum.                            The upper GI endoscopy was accomplished without                            difficulty. The patient tolerated the procedure                            well. Scope In: 8:57:58 AM Scope Out: 9:02:20 AM Total Procedure Duration: 0 hours 4 minutes 22 seconds  Findings:      The proximal esophagus was normal.      Grade I varices were found in the mid esophagus.      A post variceal banding scar was found in the distal esophagus.      The Z-line was regular and was found 40 cm from the incisors.      Moderate portal hypertensive gastropathy was found in the entire       examined stomach.      Diffuse mildly congested mucosa without active bleeding and with no       stigmata of bleeding was found in the first portion of the duodenum and       in the second portion of the duodenum. Impression:               - Normal proximal esophagus.                           -  Grade I esophageal varices.                           - Scar in the distal esophagus.                           - Z-line regular, 40 cm from the incisors.                           - Portal hypertensive gastropathy.                           - Congested duodenal mucosa.                           - No specimens collected. Moderate Sedation:      Moderate (conscious) sedation was administered by the endoscopy nurse       and supervised by the endoscopist. The following parameters were       monitored: oxygen saturation, heart rate, blood pressure, CO2       capnography and response to care. Total physician intraservice time was       10 minutes. Recommendation:           - Patient  has a contact number available for                            emergencies. The signs and symptoms of potential                            delayed complications were discussed with the                            patient. Return to normal activities tomorrow.                            Written discharge instructions were provided to the                            patient.                           - Diabetic (ADA) diet and gastroparesis diet today.                           - Continue present medications.                           - Upper adominal [place] to be scheduled                           - Repeat upper endoscopy in 6 months. Procedure Code(s):        --- Professional ---                           [phone], Esophagogastroduodenoscopy, flexible,  transoral; diagnostic, including collection of                            specimen(s) by brushing or washing, when performed                            (separate procedure)                           G0500, Moderate sedation services provided by the                            same physician or other qualified health care                            professional performing a gastrointestinal                            endoscopic service that sedation supports,                            requiring the presence of an independent trained                            observer to assist in the monitoring of the                            patient's level of consciousness and physiological                            status; initial 15 minutes of intra-service time;                            patient age 86 years or older (additional time may                            be reported with [phone], as appropriate) Diagnosis Code(s):        --- Professional ---                           I85.00, Esophageal varices without bleeding                           K22.8, Other specified diseases of esophagus                           K76.6, Portal  hypertension                           K31.89, Other diseases of stomach and duodenum CPT copyright 2017 American Medical Association. All rights reserved. The codes documented in this report are preliminary and upon coder review may  be revised to meet current compliance requirements. Hildred Laser, MD Hildred Laser, MD 12/21/2017 9:16:18 AM This report has been signed electronically. Number of Addenda: 0

## 2017-12-21 NOTE — Discharge Instructions (Signed)
Resume usual medications and diet as before. No driving for 24 hours. Abdominal ultrasound to be scheduled in near future. To have blood work in December 2019.  Blood work to include CBC comprehensive chemistry panel INR and AFP. Next EGD in 6 months.   Upper Endoscopy, Care After Refer to this sheet in the next few weeks. These instructions provide you with information about caring for yourself after your procedure. Your health care provider may also give you more specific instructions. Your treatment has been planned according to current medical practices, but problems sometimes occur. Call your health care provider if you have any problems or questions after your procedure. What can I expect after the procedure? After the procedure, it is common to have:  A sore throat.  Bloating.  Nausea.  Follow these instructions at home:  Follow instructions from your health care provider about what to eat or drink after your procedure.  Return to your normal activities as told by your health care provider. Ask your health care provider what activities are safe for you.  Take over-the-counter and prescription medicines only as told by your health care provider.  Do not drive for 24 hours if you received a sedative.  Keep all follow-up visits as told by your health care provider. This is important. Contact a health care provider if:  You have a sore throat that lasts longer than one day.  You have trouble swallowing. Get help right away if:  You have a fever.  You vomit blood or your vomit looks like coffee grounds.  You have bloody, black, or tarry stools.  You have a severe sore throat or you cannot swallow.  You have difficulty breathing.  You have severe pain in your chest or belly. This information is not intended to replace advice given to you by your health care provider. Make sure you discuss any questions you have with your health care provider. Document Released:  09/07/2011 Document Revised: 08/14/2015 Document Reviewed: 12/19/2014 Elsevier Interactive Patient Education  [employer].
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
## 2017-12-26 ENCOUNTER — Encounter (HOSPITAL_COMMUNITY): Payer: Self-pay | Admitting: Internal Medicine

## 2018-01-18 ENCOUNTER — Other Ambulatory Visit (INDEPENDENT_AMBULATORY_CARE_PROVIDER_SITE_OTHER): Payer: Self-pay | Admitting: Internal Medicine

## 2018-05-16 ENCOUNTER — Telehealth (INDEPENDENT_AMBULATORY_CARE_PROVIDER_SITE_OTHER): Payer: Self-pay | Admitting: Internal Medicine

## 2018-05-16 NOTE — Telephone Encounter (Signed)
Wife called stated patient is going thru some training and wanted to wait until after June to proceed with transplant??  Ph# [phone]

## 2018-05-17 NOTE — Telephone Encounter (Signed)
Spoke to patient - he states Dr Laural Golden mentioned being referred for liver transplant?? patietn wants to wait until June or after --please advise

## 2018-05-17 NOTE — Telephone Encounter (Signed)
Shawn Ward - is this for you? I think you were discussing this earlier.

## 2018-05-18 ENCOUNTER — Encounter (INDEPENDENT_AMBULATORY_CARE_PROVIDER_SITE_OTHER): Payer: Self-pay | Admitting: *Deleted

## 2018-05-19 NOTE — Telephone Encounter (Signed)
Okay to wait

## 2018-08-07 ENCOUNTER — Other Ambulatory Visit (INDEPENDENT_AMBULATORY_CARE_PROVIDER_SITE_OTHER): Payer: Self-pay | Admitting: Internal Medicine

## 2018-08-23 ENCOUNTER — Other Ambulatory Visit (INDEPENDENT_AMBULATORY_CARE_PROVIDER_SITE_OTHER): Payer: Self-pay | Admitting: *Deleted

## 2018-08-23 DIAGNOSIS — Z86010 Personal history of colonic polyps: Secondary | ICD-10-CM | POA: Insufficient documentation

## 2018-08-23 DIAGNOSIS — I8500 Esophageal varices without bleeding: Secondary | ICD-10-CM

## 2018-08-23 DIAGNOSIS — K7469 Other cirrhosis of liver: Secondary | ICD-10-CM

## 2018-10-13 LAB — CREATININE, IMAGING ONLY: Creatinine: 0.5 mg/dL — ABNORMAL LOW (ref 0.8–1.3)

## 2018-10-30 ENCOUNTER — Encounter (INDEPENDENT_AMBULATORY_CARE_PROVIDER_SITE_OTHER): Payer: Self-pay | Admitting: *Deleted

## 2018-10-30 ENCOUNTER — Telehealth (INDEPENDENT_AMBULATORY_CARE_PROVIDER_SITE_OTHER): Payer: Self-pay | Admitting: *Deleted

## 2018-10-30 MED ORDER — PEG 3350-KCL-NA BICARB-NACL 420 G PO SOLR: 4000.0000 mL | Freq: Once | ORAL | 0 refills | Status: AC

## 2018-10-30 NOTE — Telephone Encounter (Signed)
Patent needs trilyte TCS sch'd in Sept

## 2018-11-01 ENCOUNTER — Other Ambulatory Visit: Payer: Self-pay

## 2018-11-01 ENCOUNTER — Telehealth (INDEPENDENT_AMBULATORY_CARE_PROVIDER_SITE_OTHER): Payer: Self-pay | Admitting: *Deleted

## 2018-11-01 ENCOUNTER — Ambulatory Visit (INDEPENDENT_AMBULATORY_CARE_PROVIDER_SITE_OTHER): Payer: Self-pay

## 2018-11-01 NOTE — Telephone Encounter (Signed)
Referring MD/PCP: tucker   Procedure: tcs/egd  Reason/Indication:  Hx polyps, esophageal varices, cirrhosis  Has patient had this procedure before?  Yes, TCS 2015 & EGD 2019  If so, when, by whom and where?    Is there a family history of colon cancer?  no  Who?  What age when diagnosed?    Is patient diabetic?   yes      Does patient have prosthetic heart valve or mechanical valve?  no  Do you have a pacemaker/defibrillator?  no  Has patient ever had endocarditis/atrial fibrillation? no  Does patient use oxygen? no  Has patient had joint replacement within last 12 months?  no  Is patient constipated or do they take laxatives? no  Does patient have a history of alcohol/drug use?  no  Is patient on blood thinner such as Coumadin, Plavix and/or Aspirin? no  Medications: see epic  Allergies: nkda  Medication Adjustment per Dr Lindi Adie, NP: don't take diabetic medicine evening before and morning of  Procedure date & time: 11/29/18 at 1200

## 2018-11-02 NOTE — Telephone Encounter (Signed)
Shawn Ward to schedule procedures but patient needs to schedule an office follow up appointment after his procedures, he should have appropriate cirrhosis follow up.

## 2018-11-28 ENCOUNTER — Other Ambulatory Visit: Payer: Self-pay

## 2018-11-28 ENCOUNTER — Other Ambulatory Visit (HOSPITAL_COMMUNITY)
Admission: RE | Admit: 2018-11-28 | Discharge: 2018-11-28 | Disposition: A | Discharge status: Home / Self Care | Payer: BC Managed Care – PPO | Source: Ambulatory Visit | Attending: Internal Medicine | Admitting: Internal Medicine

## 2018-11-28 DIAGNOSIS — Z20828 Contact with and (suspected) exposure to other viral communicable diseases: Secondary | ICD-10-CM | POA: Insufficient documentation

## 2018-11-28 DIAGNOSIS — Z01812 Encounter for preprocedural laboratory examination: Secondary | ICD-10-CM | POA: Insufficient documentation

## 2018-11-29 ENCOUNTER — Other Ambulatory Visit: Payer: Self-pay

## 2018-11-29 ENCOUNTER — Encounter (HOSPITAL_COMMUNITY): Payer: Self-pay | Admitting: *Deleted

## 2018-11-29 ENCOUNTER — Encounter (HOSPITAL_COMMUNITY)
Admission: RE | Disposition: A | Discharge status: Home / Self Care | Payer: Self-pay | Source: Ambulatory Visit | Attending: Internal Medicine

## 2018-11-29 ENCOUNTER — Ambulatory Visit (HOSPITAL_COMMUNITY)
Admission: RE | Admit: 2018-11-29 | Discharge: 2018-11-29 | Disposition: A | Discharge status: Home / Self Care | Payer: BC Managed Care – PPO | Source: Ambulatory Visit | Attending: Internal Medicine | Admitting: Internal Medicine

## 2018-11-29 DIAGNOSIS — I8500 Esophageal varices without bleeding: Secondary | ICD-10-CM | POA: Insufficient documentation

## 2018-11-29 DIAGNOSIS — Z09 Encounter for follow-up examination after completed treatment for conditions other than malignant neoplasm: Secondary | ICD-10-CM | POA: Diagnosis not present

## 2018-11-29 DIAGNOSIS — K7469 Other cirrhosis of liver: Secondary | ICD-10-CM | POA: Insufficient documentation

## 2018-11-29 DIAGNOSIS — K449 Diaphragmatic hernia without obstruction or gangrene: Secondary | ICD-10-CM | POA: Insufficient documentation

## 2018-11-29 DIAGNOSIS — I8510 Secondary esophageal varices without bleeding: Secondary | ICD-10-CM | POA: Insufficient documentation

## 2018-11-29 DIAGNOSIS — Z860100 Personal history of colon polyps, unspecified: Secondary | ICD-10-CM | POA: Insufficient documentation

## 2018-11-29 DIAGNOSIS — E119 Type 2 diabetes mellitus without complications: Secondary | ICD-10-CM | POA: Diagnosis not present

## 2018-11-29 DIAGNOSIS — Z7984 Long term (current) use of oral hypoglycemic drugs: Secondary | ICD-10-CM | POA: Diagnosis not present

## 2018-11-29 DIAGNOSIS — K7460 Unspecified cirrhosis of liver: Secondary | ICD-10-CM | POA: Insufficient documentation

## 2018-11-29 DIAGNOSIS — K317 Polyp of stomach and duodenum: Secondary | ICD-10-CM | POA: Insufficient documentation

## 2018-11-29 DIAGNOSIS — K228 Other specified diseases of esophagus: Secondary | ICD-10-CM | POA: Insufficient documentation

## 2018-11-29 DIAGNOSIS — Z8719 Personal history of other diseases of the digestive system: Secondary | ICD-10-CM

## 2018-11-29 DIAGNOSIS — K766 Portal hypertension: Secondary | ICD-10-CM

## 2018-11-29 DIAGNOSIS — E669 Obesity, unspecified: Secondary | ICD-10-CM | POA: Diagnosis not present

## 2018-11-29 DIAGNOSIS — K6389 Other specified diseases of intestine: Secondary | ICD-10-CM

## 2018-11-29 DIAGNOSIS — K7581 Nonalcoholic steatohepatitis (NASH): Secondary | ICD-10-CM | POA: Insufficient documentation

## 2018-11-29 DIAGNOSIS — E785 Hyperlipidemia, unspecified: Secondary | ICD-10-CM | POA: Diagnosis not present

## 2018-11-29 DIAGNOSIS — K3189 Other diseases of stomach and duodenum: Secondary | ICD-10-CM

## 2018-11-29 DIAGNOSIS — Z79899 Other long term (current) drug therapy: Secondary | ICD-10-CM | POA: Diagnosis not present

## 2018-11-29 DIAGNOSIS — Z1211 Encounter for screening for malignant neoplasm of colon: Secondary | ICD-10-CM | POA: Diagnosis not present

## 2018-11-29 HISTORY — PX: COLONOSCOPY: SHX5424

## 2018-11-29 HISTORY — PX: ESOPHAGOGASTRODUODENOSCOPY: SHX5428

## 2018-11-29 LAB — GLUCOSE, CAPILLARY: Glucose-Capillary: 125 mg/dL — ABNORMAL HIGH (ref 70–99)

## 2018-11-29 LAB — SARS CORONAVIRUS 2 (TAT 6-24 HRS): SARS Coronavirus 2: NEGATIVE

## 2018-11-29 SURGERY — EGD (ESOPHAGOGASTRODUODENOSCOPY)
Anesthesia: Moderate Sedation

## 2018-11-29 MED ORDER — MIDAZOLAM HCL 5 MG/5ML IJ SOLN
INTRAMUSCULAR | Status: AC
  Filled 2018-11-29: qty 10

## 2018-11-29 MED ORDER — MEPERIDINE HCL 50 MG/ML IJ SOLN
INTRAMUSCULAR | Status: DC | PRN
  Administered 2018-11-29 (×2): 25 mg

## 2018-11-29 MED ORDER — SODIUM CHLORIDE 0.9 % IV SOLN
INTRAVENOUS | Status: DC
  Administered 2018-11-29: 1000 mL via INTRAVENOUS

## 2018-11-29 MED ORDER — LIDOCAINE VISCOUS HCL 2 % MT SOLN
OROMUCOSAL | Status: DC | PRN
  Administered 2018-11-29: 1 via OROMUCOSAL

## 2018-11-29 MED ORDER — LIDOCAINE VISCOUS HCL 2 % MT SOLN
OROMUCOSAL | Status: AC
  Filled 2018-11-29: qty 15

## 2018-11-29 MED ORDER — MIDAZOLAM HCL 5 MG/5ML IJ SOLN
INTRAMUSCULAR | Status: DC | PRN
  Administered 2018-11-29: 2 mg via INTRAVENOUS
  Administered 2018-11-29: 1 mg via INTRAVENOUS
  Administered 2018-11-29: 2 mg via INTRAVENOUS

## 2018-11-29 MED ORDER — STERILE WATER FOR IRRIGATION IR SOLN
Status: DC | PRN
  Administered 2018-11-29: 13:00:00 1.5 mL

## 2018-11-29 MED ORDER — MEPERIDINE HCL 50 MG/ML IJ SOLN
INTRAMUSCULAR | Status: AC
  Filled 2018-11-29: qty 1

## 2018-11-29 NOTE — Discharge Instructions (Signed)
Resume usual medications as before Please double check potassium dose. Resume usual usual diet. No driving for 24 hours. Repeat EGD in 6 months.       Upper Endoscopy, Adult, Care After This sheet gives you information about how to care for yourself after your procedure. Your health care provider may also give you more specific instructions. If you have problems or questions, contact your health care provider. What can I expect after the procedure? After the procedure, it is common to have:  A sore throat.  Mild stomach pain or discomfort.  Bloating.  Nausea. Follow these instructions at home:   Follow instructions from your health care provider about what to eat or drink after your procedure.  Return to your normal activities as told by your health care provider. Ask your health care provider what activities are safe for you.  Take over-the-counter and prescription medicines only as told by your health care provider.  Do not drive for 24 hours if you were given a sedative during your procedure.  Keep all follow-up visits as told by your health care provider. This is important. Contact a health care provider if you have:  A sore throat that lasts longer than one day.  Trouble swallowing. Get help right away if:  You vomit blood or your vomit looks like coffee grounds.  You have: ? A fever. ? Bloody, black, or tarry stools. ? A severe sore throat or you cannot swallow. ? Difficulty breathing. ? Severe pain in your chest or abdomen. Summary  After the procedure, it is common to have a sore throat, mild stomach discomfort, bloating, and nausea.  Do not drive for 24 hours if you were given a sedative during the procedure.  Follow instructions from your health care provider about what to eat or drink after your procedure.  Return to your normal activities as told by your health care provider. This information is not intended to replace advice given to you by your  health care provider. Make sure you discuss any questions you have with your health care provider. Document Released: 09/07/2011 Document Revised: 08/30/2017 Document Reviewed: 08/08/2017 Elsevier Patient Education  2020 [employer].     Colonoscopy, Adult, Care After This sheet gives you information about how to care for yourself after your procedure. Your doctor may also give you more specific instructions. If you have problems or questions, call your doctor. What can I expect after the procedure? After the procedure, it is common to have:  A small amount of blood in your poop for 24 hours.  Some gas.  Mild cramping or bloating in your belly. Follow these instructions at home: General instructions  For the first 24 hours after the procedure: ? Do not drive or use machinery. ? Do not sign important documents. ? Do not drink alcohol. ? Do your daily activities more slowly than normal. ? Eat foods that are soft and easy to digest.  Take over-the-counter or prescription medicines only as told by your doctor. To help cramping and bloating:   Try walking around.  Put heat on your belly (abdomen) as told by your doctor. Use a heat source that your doctor recommends, such as a moist heat pack or a heating pad. ? Put a towel between your skin and the heat source. ? Leave the heat on for 20-30 minutes. ? Remove the heat if your skin turns bright red. This is especially important if you cannot feel pain, heat, or cold. You can get burned.  Eating and drinking   Drink enough fluid to keep your pee (urine) clear or pale yellow.  Return to your normal diet as told by your doctor. Avoid heavy or fried foods that are hard to digest.  Avoid drinking alcohol for as long as told by your doctor. Contact a doctor if:  You have blood in your poop (stool) 2-3 days after the procedure. Get help right away if:  You have more than a small amount of blood in your poop.  You see large  clumps of tissue (blood clots) in your poop.  Your belly is swollen.  You feel sick to your stomach (nauseous).  You throw up (vomit).  You have a fever.  You have belly pain that gets worse, and medicine does not help your pain. Summary  After the procedure, it is common to have a small amount of blood in your poop. You may also have mild cramping and bloating in your belly.  For the first 24 hours after the procedure, do not drive or use machinery, do not sign important documents, and do not drink alcohol.  Get help right away if you have a lot of blood in your poop, feel sick to your stomach, have a fever, or have more belly pain. This information is not intended to replace advice given to you by your health care provider. Make sure you discuss any questions you have with your health care provider. Document Released: 04/10/2010 Document Revised: 01/06/2017 Document Reviewed: 12/01/2015 Elsevier Patient Education  2020 [employer].
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
## 2018-11-29 NOTE — Op Note (Signed)
[facility] Patient Name: Shawn Ward Procedure Date: 11/29/2018 11:13 AM MRN: [id number] Date of Birth: 10-Jun-1960 Attending MD: Hildred Laser , MD CSN: [id number] Age: 58 Admit Type: Outpatient Procedure:                Upper GI endoscopy Indications:              Esophageal varices, Follow-up of esophageal                            varices, For therapy of esophageal varices Providers:                Hildred Laser, MD, Gerome Sam, RN, Randa Spike, Technician Referring MD:              Medicines:                Lidocaine spray, Meperidine 50 mg IV, Midazolam 4                            mg IV Complications:            No immediate complications. Estimated Blood Loss:     Estimated blood loss: none. Procedure:                Pre-Anesthesia Assessment:                           - Prior to the procedure, a History and Physical                            was performed, and patient medications and                            allergies were reviewed. The patient's tolerance of                            previous anesthesia was also reviewed. The risks                            and benefits of the procedure and the sedation                            options and risks were discussed with the patient.                            All questions were answered, and informed consent                            was obtained. Prior Anticoagulants: The patient has                            taken no previous anticoagulant or antiplatelet  agents. ASA Grade Assessment: III - A patient with                            severe systemic disease. After reviewing the risks                            and benefits, the patient was deemed in                            satisfactory condition to undergo the procedure.                           After obtaining informed consent, the endoscope was                            passed under direct vision.  Throughout the                            procedure, the patient's blood pressure, pulse, and                            oxygen saturations were monitored continuously. The                            GIF-H190 ([id number]) was introduced through the                            mouth, and advanced to the second part of duodenum.                            The upper GI endoscopy was accomplished without                            difficulty. The patient tolerated the procedure                            well. Scope In: 02:54:27 PM Scope Out: 12:52:00 PM Total Procedure Duration: 0 hours 4 minutes 42 seconds  Findings:      The proximal esophagus was normal.      Grade I varices were found in the mid esophagus and in the distal       esophagus.      A post variceal banding scar was found in the distal esophagus.      The Z-line was regular and was found 38 cm from the incisors. Single       erosion at distal esophagus.      Moderate portal hypertensive gastropathy was found in the gastric fundus       and in the gastric body.      A few small sessile polyps were found in the gastric fundus and in the       gastric body.      The exam of the stomach was otherwise normal.      The duodenal bulb and second portion of the duodenum were normal.      The duodenal bulb and second portion of  the duodenum were normal.      A 2 cm hiatal hernia was present. Impression:               - Normal proximal esophagus except single erosion                            proximal to GEJ.                           - Grade I esophageal varices.                           - Scar in the distal esophagus.                           - Small sliding hiatal hernia.                           - Portal hypertensive gastropathy.                           - A few gastric polyps.                           - Normal duodenal bulb and second portion of the                            duodenum.                           - Normal  duodenal bulb and second portion of the                            duodenum.                           - No specimens collected. Moderate Sedation:      Moderate (conscious) sedation was administered by the endoscopy nurse       and supervised by the endoscopist. The following parameters were       monitored: oxygen saturation, heart rate, blood pressure, CO2       capnography and response to care. Total physician intraservice time was       9 minutes. Recommendation:           - Patient has a contact number available for                            emergencies. The signs and symptoms of potential                            delayed complications were discussed with the                            patient. Return to normal activities tomorrow.                            Written discharge instructions were provided  to the                            patient.                           - Resume previous diet today.                           - Continue present medications.                           - No aspirin, ibuprofen, naproxen, or other                            non-steroidal anti-inflammatory drugs.                           - Repeat upper endoscopy in 6 months. Procedure Code(s):        --- Professional ---                           [phone], Esophagogastroduodenoscopy, flexible,                            transoral; diagnostic, including collection of                            specimen(s) by brushing or washing, when performed                            (separate procedure) Diagnosis Code(s):        --- Professional ---                           I85.00, Esophageal varices without bleeding                           K22.8, Other specified diseases of esophagus                           K76.6, Portal hypertension                           K31.89, Other diseases of stomach and duodenum                           K31.7, Polyp of stomach and duodenum CPT copyright 2019 American Medical  Association. All rights reserved. The codes documented in this report are preliminary and upon coder review may  be revised to meet current compliance requirements. Hildred Laser, MD Hildred Laser, MD 11/29/2018 1:26:57 PM This report has been signed electronically. Number of Addenda: 0

## 2018-11-29 NOTE — Op Note (Signed)
[facility] Patient Name: Shawn Ward Procedure Date: 11/29/2018 12:54 PM MRN: [id number] Date of Birth: 04/05/1960 Attending MD: Hildred Laser , MD CSN: [id number] Age: 58 Admit Type: Outpatient Procedure:                Colonoscopy Indications:              High risk colon cancer surveillance: Personal                            history of colonic polyps Providers:                Hildred Laser, MD, Gerome Sam, RN, Raphael Gibney, Technician Referring MD:              Medicines:                Midazolam 1 mg IV Complications:            No immediate complications. Estimated Blood Loss:     Estimated blood loss: none. Procedure:                Pre-Anesthesia Assessment:                           - Prior to the procedure, a History and Physical                            was performed, and patient medications and                            allergies were reviewed. The patient's tolerance of                            previous anesthesia was also reviewed. The risks                            and benefits of the procedure and the sedation                            options and risks were discussed with the patient.                            All questions were answered, and informed consent                            was obtained. Prior Anticoagulants: The patient has                            taken no previous anticoagulant or antiplatelet                            agents. ASA Grade Assessment: III - A patient with  severe systemic disease. After reviewing the risks                            and benefits, the patient was deemed in                            satisfactory condition to undergo the procedure.                           After obtaining informed consent, the colonoscope                            was passed under direct vision. Throughout the                            procedure, the patient's blood pressure,  pulse, and                            oxygen saturations were monitored continuously. The                            [id number]-[id number] ([id number]) scope was introduced through                            the anus and advanced to the the cecum, identified                            by appendiceal orifice and ileocecal valve. The                            colonoscopy was performed without difficulty. The                            patient tolerated the procedure well. The quality                            of the bowel preparation was excellent except the                            ascending colon was poor. The ileocecal valve, the                            appendiceal orifice and the rectum were                            photographed. Scope In: 12:57:43 PM Scope Out: 1:11:59 PM Scope Withdrawal Time: 0 hours 5 minutes 25 seconds  Total Procedure Duration: 0 hours 14 minutes 16 seconds  Findings:      The perianal and digital rectal examinations were normal.      An area of moderately congested mucosa was found in the entire colon.      The exam was otherwise normal throughout the examined colon.      The retroflexed view of the distal rectum and anal verge was normal and  showed no anal or rectal abnormalities. Impression:               - Portal hypertensive colopathy.. bleed on washing.                           - No specimens collected. Moderate Sedation:      Moderate (conscious) sedation was administered by the endoscopy nurse       and supervised by the endoscopist. The following parameters were       monitored: oxygen saturation, heart rate, blood pressure, CO2       capnography and response to care. Total physician intraservice time was       15 minutes. Recommendation:           - Patient has a contact number available for                            emergencies. The signs and symptoms of potential                            delayed complications were discussed with the                             patient. Return to normal activities tomorrow.                            Written discharge instructions were provided to the                            patient.                           - Resume previous diet today.                           - Continue present medications.                           - no recommendations for repeat colonoscopy. Procedure Code(s):        --- Professional ---                           [phone], Colonoscopy, flexible; diagnostic, including                            collection of specimen(s) by brushing or washing,                            when performed (separate procedure)                           G0500, Moderate sedation services provided by the                            same physician or other qualified health care  professional performing a gastrointestinal                            endoscopic service that sedation supports,                            requiring the presence of an independent trained                            observer to assist in the monitoring of the                            patient's level of consciousness and physiological                            status; initial 15 minutes of intra-service time;                            patient age 44 years or older (additional time may                            be reported with [phone], as appropriate) Diagnosis Code(s):        --- Professional ---                           Z86.010, Personal history of colonic polyps                           K63.89, Other specified diseases of intestine CPT copyright 2019 American Medical Association. All rights reserved. The codes documented in this report are preliminary and upon coder review may  be revised to meet current compliance requirements. Hildred Laser, MD Hildred Laser, MD 11/29/2018 1:32:00 PM This report has been signed electronically. Number of Addenda: 0

## 2018-12-01 ENCOUNTER — Encounter (HOSPITAL_COMMUNITY): Payer: Self-pay | Admitting: Internal Medicine

## 2019-03-20 LAB — CREATININE, IMAGING ONLY: Creatinine: 0.6 mg/dL — ABNORMAL LOW (ref 0.8–1.3)

## 2019-05-17 ENCOUNTER — Encounter (INDEPENDENT_AMBULATORY_CARE_PROVIDER_SITE_OTHER): Payer: Self-pay | Admitting: *Deleted

## 2019-05-22 ENCOUNTER — Other Ambulatory Visit (INDEPENDENT_AMBULATORY_CARE_PROVIDER_SITE_OTHER): Payer: Self-pay | Admitting: *Deleted

## 2019-05-24 ENCOUNTER — Other Ambulatory Visit (INDEPENDENT_AMBULATORY_CARE_PROVIDER_SITE_OTHER): Payer: Self-pay | Admitting: *Deleted

## 2019-05-24 DIAGNOSIS — K7469 Other cirrhosis of liver: Secondary | ICD-10-CM

## 2019-05-24 DIAGNOSIS — I8500 Esophageal varices without bleeding: Secondary | ICD-10-CM

## 2019-07-24 ENCOUNTER — Encounter (INDEPENDENT_AMBULATORY_CARE_PROVIDER_SITE_OTHER): Payer: Self-pay | Admitting: *Deleted

## 2019-08-08 ENCOUNTER — Telehealth (INDEPENDENT_AMBULATORY_CARE_PROVIDER_SITE_OTHER): Payer: Self-pay | Admitting: *Deleted

## 2019-08-08 ENCOUNTER — Ambulatory Visit (INDEPENDENT_AMBULATORY_CARE_PROVIDER_SITE_OTHER): Payer: Self-pay

## 2019-08-08 ENCOUNTER — Other Ambulatory Visit: Payer: Self-pay

## 2019-08-08 NOTE — Telephone Encounter (Signed)
Referring MD/PCP: tucker   Procedure: egd  Reason/Indication:  esophageal varices, cirrhosis  Has patient had this procedure before?  Yes, 11/2018             If so, when, by whom and where?    Is there a family history of colon cancer?  no             Who?  What age when diagnosed?    Is patient diabetic?   yes                                                  Does patient have prosthetic heart valve or mechanical valve?  no  Do you have a pacemaker/defibrillator?  no  Has patient ever had endocarditis/atrial fibrillation? no  Does patient use oxygen? no  Has patient had joint replacement within last 12 months?  no  Is patient constipated or do they take laxatives? no  Does patient have a history of alcohol/drug use?  no  Is patient on blood thinner such as Coumadin, Plavix and/or Aspirin? no  Medications: xifaxin 550 mg bid, potassium 99 mg tid, pantoprazole 40 mg daily, nadolol 20 mg daily, metoclopramide 5 mg tid, metformin 1000 mg bid, vit d3 daily, tylenol  Allergies: nkda  Medication Adjustment per Dr Laural Golden:   Procedure date & time: 09/05/19 at 730

## 2019-08-10 NOTE — Telephone Encounter (Signed)
Ok to schedule.

## 2019-09-03 ENCOUNTER — Other Ambulatory Visit (HOSPITAL_COMMUNITY)
Admission: RE | Admit: 2019-09-03 | Discharge: 2019-09-03 | Disposition: A | Discharge status: Home / Self Care | Payer: BC Managed Care – PPO | Source: Ambulatory Visit | Attending: Internal Medicine | Admitting: Internal Medicine

## 2019-09-03 NOTE — Progress Notes (Signed)
Called patient to let him know he missed his 3:30 COVID test appointment. Patient apologized for missing appt.. Asked patient to call his Dr's office to reschedule. Pt said he'd call them and see if he could get scheduled early tomorrow morning. Nothing further needed.

## 2019-09-04 ENCOUNTER — Other Ambulatory Visit: Payer: Self-pay

## 2019-09-04 ENCOUNTER — Other Ambulatory Visit (HOSPITAL_COMMUNITY)
Admission: RE | Admit: 2019-09-04 | Discharge: 2019-09-04 | Disposition: A | Discharge status: Home / Self Care | Payer: BC Managed Care – PPO | Source: Ambulatory Visit | Attending: Internal Medicine | Admitting: Internal Medicine

## 2019-09-04 DIAGNOSIS — Z20822 Contact with and (suspected) exposure to covid-19: Secondary | ICD-10-CM | POA: Diagnosis not present

## 2019-09-04 DIAGNOSIS — Z01812 Encounter for preprocedural laboratory examination: Secondary | ICD-10-CM | POA: Diagnosis present

## 2019-09-04 LAB — SARS CORONAVIRUS 2 (TAT 6-24 HRS): SARS Coronavirus 2: NEGATIVE

## 2019-09-05 ENCOUNTER — Other Ambulatory Visit: Payer: Self-pay

## 2019-09-05 ENCOUNTER — Ambulatory Visit (HOSPITAL_COMMUNITY)
Admission: RE | Admit: 2019-09-05 | Discharge: 2019-09-05 | Disposition: A | Discharge status: Home / Self Care | Payer: BC Managed Care – PPO | Attending: Internal Medicine | Admitting: Internal Medicine

## 2019-09-05 ENCOUNTER — Encounter (HOSPITAL_COMMUNITY): Payer: Self-pay | Admitting: Internal Medicine

## 2019-09-05 ENCOUNTER — Encounter (HOSPITAL_COMMUNITY)
Admission: RE | Disposition: A | Discharge status: Home / Self Care | Payer: Self-pay | Source: Home / Self Care | Attending: Internal Medicine

## 2019-09-05 DIAGNOSIS — I8510 Secondary esophageal varices without bleeding: Secondary | ICD-10-CM | POA: Diagnosis present

## 2019-09-05 DIAGNOSIS — K766 Portal hypertension: Secondary | ICD-10-CM | POA: Diagnosis not present

## 2019-09-05 DIAGNOSIS — E119 Type 2 diabetes mellitus without complications: Secondary | ICD-10-CM | POA: Diagnosis not present

## 2019-09-05 DIAGNOSIS — Z800 Family history of malignant neoplasm of digestive organs: Secondary | ICD-10-CM | POA: Diagnosis not present

## 2019-09-05 DIAGNOSIS — Z7984 Long term (current) use of oral hypoglycemic drugs: Secondary | ICD-10-CM | POA: Insufficient documentation

## 2019-09-05 DIAGNOSIS — K317 Polyp of stomach and duodenum: Secondary | ICD-10-CM

## 2019-09-05 DIAGNOSIS — I872 Venous insufficiency (chronic) (peripheral): Secondary | ICD-10-CM | POA: Insufficient documentation

## 2019-09-05 DIAGNOSIS — K7469 Other cirrhosis of liver: Secondary | ICD-10-CM | POA: Diagnosis not present

## 2019-09-05 DIAGNOSIS — K3189 Other diseases of stomach and duodenum: Secondary | ICD-10-CM | POA: Diagnosis not present

## 2019-09-05 DIAGNOSIS — Z833 Family history of diabetes mellitus: Secondary | ICD-10-CM | POA: Insufficient documentation

## 2019-09-05 DIAGNOSIS — K228 Other specified diseases of esophagus: Secondary | ICD-10-CM | POA: Diagnosis not present

## 2019-09-05 DIAGNOSIS — K7581 Nonalcoholic steatohepatitis (NASH): Secondary | ICD-10-CM | POA: Insufficient documentation

## 2019-09-05 DIAGNOSIS — Z79899 Other long term (current) drug therapy: Secondary | ICD-10-CM | POA: Diagnosis not present

## 2019-09-05 DIAGNOSIS — Z6834 Body mass index (BMI) 34.0-34.9, adult: Secondary | ICD-10-CM | POA: Insufficient documentation

## 2019-09-05 DIAGNOSIS — E785 Hyperlipidemia, unspecified: Secondary | ICD-10-CM | POA: Insufficient documentation

## 2019-09-05 DIAGNOSIS — K7460 Unspecified cirrhosis of liver: Secondary | ICD-10-CM | POA: Insufficient documentation

## 2019-09-05 DIAGNOSIS — Z803 Family history of malignant neoplasm of breast: Secondary | ICD-10-CM | POA: Insufficient documentation

## 2019-09-05 DIAGNOSIS — R29810 Facial weakness: Secondary | ICD-10-CM | POA: Insufficient documentation

## 2019-09-05 DIAGNOSIS — E669 Obesity, unspecified: Secondary | ICD-10-CM | POA: Insufficient documentation

## 2019-09-05 DIAGNOSIS — I8500 Esophageal varices without bleeding: Secondary | ICD-10-CM

## 2019-09-05 HISTORY — PX: ESOPHAGOGASTRODUODENOSCOPY: SHX5428

## 2019-09-05 HISTORY — PX: ESOPHAGEAL BANDING: SHX5518

## 2019-09-05 LAB — GLUCOSE, CAPILLARY: Glucose-Capillary: 92 mg/dL (ref 70–99)

## 2019-09-05 SURGERY — EGD (ESOPHAGOGASTRODUODENOSCOPY)
Anesthesia: Moderate Sedation

## 2019-09-05 MED ORDER — MIDAZOLAM HCL 5 MG/5ML IJ SOLN
INTRAMUSCULAR | Status: DC | PRN
  Administered 2019-09-05 (×3): 2 mg via INTRAVENOUS

## 2019-09-05 MED ORDER — MEPERIDINE HCL 50 MG/ML IJ SOLN
INTRAMUSCULAR | Status: DC | PRN
  Administered 2019-09-05 (×2): 25 mg via INTRAVENOUS

## 2019-09-05 MED ORDER — STERILE WATER FOR IRRIGATION IR SOLN
Status: DC | PRN
  Administered 2019-09-05: 1.5 mL

## 2019-09-05 MED ORDER — LIDOCAINE VISCOUS HCL 2 % MT SOLN
OROMUCOSAL | Status: DC | PRN
  Administered 2019-09-05: 1 via OROMUCOSAL

## 2019-09-05 MED ORDER — SODIUM CHLORIDE 0.9 % IV SOLN: INTRAVENOUS | Status: DC

## 2019-09-05 MED ORDER — MIDAZOLAM HCL 5 MG/5ML IJ SOLN
INTRAMUSCULAR | Status: AC
  Filled 2019-09-05: qty 10

## 2019-09-05 MED ORDER — LIDOCAINE VISCOUS HCL 2 % MT SOLN
OROMUCOSAL | Status: AC
  Filled 2019-09-05: qty 15

## 2019-09-05 MED ORDER — MEPERIDINE HCL 50 MG/ML IJ SOLN
INTRAMUSCULAR | Status: AC
  Filled 2019-09-05: qty 1

## 2019-09-05 NOTE — Op Note (Signed)
[facility] Patient Name: Shawn Ward Procedure Date: 09/05/2019 7:08 AM MRN: [id number] Date of Birth: 1960/03/27 Attending MD: Hildred Laser , MD CSN: [id number] Age: 59 Admit Type: Outpatient Procedure:                Upper GI endoscopy Indications:              Esophageal varices, Follow-up of esophageal                            varices, For therapy of esophageal varices Providers:                Hildred Laser, MD, Charlsie Quest. Theda Sers RN, RN, Aram Candela Referring MD:              Medicines:                Lidocaine spray, Meperidine 50 mg IV, Midazolam 6                            mg IV Complications:            No immediate complications. Estimated Blood Loss:     Estimated blood loss: none. Procedure:                Pre-Anesthesia Assessment:                           - Prior to the procedure, a History and Physical                            was performed, and patient medications and                            allergies were reviewed. The patient's tolerance of                            previous anesthesia was also reviewed. The risks                            and benefits of the procedure and the sedation                            options and risks were discussed with the patient.                            All questions were answered, and informed consent                            was obtained. Prior Anticoagulants: The patient has                            taken no previous anticoagulant or antiplatelet  agents. ASA Grade Assessment: III - A patient with                            severe systemic disease. After reviewing the risks                            and benefits, the patient was deemed in                            satisfactory condition to undergo the procedure.                           After obtaining informed consent, the endoscope was                            passed under direct vision. Throughout  the                            procedure, the patient's blood pressure, pulse, and                            oxygen saturations were monitored continuously. The                            GIF-H190 ([id number]) scope was introduced through the                            mouth, and advanced to the second part of duodenum.                            The upper GI endoscopy was accomplished without                            difficulty. The patient tolerated the procedure                            well. Scope In: 7:45:34 AM Scope Out: 7:55:22 AM Total Procedure Duration: 0 hours 9 minutes 48 seconds  Findings:      The hypopharynx was normal.      The upper third of the esophagus was normal.      Grade I, grade II varices were found in the mid esophagus. Two bands       successfully placed over one varix. There was no bleeding during and at       the end of the procedure.      A post variceal banding scar was found in the distal esophagus.      The Z-line was regular and was found 40 cm from the incisors.      Moderate portal hypertensive gastropathy was found in the entire       examined stomach.      Multiple small sessile polyps with no stigmata of recent bleeding were       found in the gastric fundus and in the gastric body.      The pylorus was normal.      The duodenal  bulb and second portion of the duodenum were normal. Impression:               - Normal hypopharynx.                           - Normal upper third of esophagus.                           - Grade I and grade II esophageal varices. One                            column banded. Other two columns small.                           - Scar in the distal esophagus.                           - Z-line regular, 40 cm from the incisors.                           - Portal hypertensive gastropathy.                           - Multiple gastric polyps.                           - Normal pylorus.                           - Normal  duodenal bulb and second portion of the                            duodenum.                           - No specimens collected. Moderate Sedation:      Moderate (conscious) sedation was administered by the endoscopy nurse       and supervised by the endoscopist. The following parameters were       monitored: oxygen saturation, heart rate, blood pressure, CO2       capnography and response to care. Total physician intraservice time was       15 minutes. Recommendation:           - Patient has a contact number available for                            emergencies. The signs and symptoms of potential                            delayed complications were discussed with the                            patient. Return to normal activities tomorrow.                            Written discharge instructions were provided to the  patient.                           - Resume previous diet today.                           - Continue present medications.                           - No aspirin, ibuprofen, naproxen, or other                            non-steroidal anti-inflammatory drugs.                           - Repeat upper endoscopy in 6 months. Procedure Code(s):        --- Professional ---                           [phone], Esophagogastroduodenoscopy, flexible,                            transoral; with band ligation of esophageal/gastric                            varices                           G0500, Moderate sedation services provided by the                            same physician or other qualified health care                            professional performing a gastrointestinal                            endoscopic service that sedation supports,                            requiring the presence of an independent trained                            observer to assist in the monitoring of the                            patient's level of consciousness and  physiological                            status; initial 15 minutes of intra-service time;                            patient age 62 years or older (additional time may                            be reported with [phone], as appropriate) Diagnosis Code(s):        ---  Professional ---                           I85.00, Esophageal varices without bleeding                           K22.8, Other specified diseases of esophagus                           K76.6, Portal hypertension                           K31.89, Other diseases of stomach and duodenum                           K31.7, Polyp of stomach and duodenum CPT copyright 2019 [employer]. All rights reserved. The codes documented in this report are preliminary and upon coder review may  be revised to meet current compliance requirements. Hildred Laser, MD Hildred Laser, MD 09/05/2019 8:13:27 AM This report has been signed electronically. Number of Addenda: 0

## 2019-09-05 NOTE — Discharge Instructions (Signed)
Esophageal Variceal Ligation, Care After This sheet gives you information about how to care for yourself after your procedure. Your doctor may also give you more specific instructions. If you have problems or questions, contact your doctor. What can I expect after the procedure? After the procedure, it is common to have:  Bleeding.  Pain and soreness in your chest area.  Trouble swallowing. Follow these instructions at home:  Eating and drinking Follow instructions from your doctor about what you can eat or drink.  You will have limits on what you can eat for the first 1-2 days after your procedure.  You will start with a liquid diet. Later, you will start to eat soft foods.  Do not drink alcohol.  Activity  Return to your normal activities as told by your doctor. Ask your doctor what activities are safe for you.  Do not lift anything that is heavier than 10 lb (4.5 kg), or the limit that you are told, until your doctor says that it is safe.  Do not drive or use heavy machinery while taking prescription pain medicine. General instructions  Take over-the-counter and prescription medicines only as told by your doctor.  Do not use any products that contain nicotine or tobacco, such as cigarettes and e-cigarettes. If you need help quitting, ask your doctor.  Keep all follow-up visits as told by your doctor. This is important. Contact a doctor if:  You have chest pain that lasts for more than 3 days after you go home.  You have trouble swallowing that lasts for more than 3 days after you go home.  You have a fever or chills. Get help right away if:  You have bleeding from your throat.  You have bleeding from your bottom (rectum).  You throw up (vomit) bright red blood.  You are unable to swallow.  You are short of breath.  You have very bad chest pain or back pain. Summary  After the procedure, it is common to have pain, bleeding, and trouble swallowing.  Follow  all your home care instructions.  Stay on a liquid or soft diet until your doctor says that you can go back to your normal diet.  Contact a doctor if you have chills, fever, chest pain, or trouble swallowing.  Get help right away if you have bleeding, are unable to swallow, or have very bad chest or back pain. This information is not intended to replace advice given to you by your health care provider. Make sure you discuss any questions you have with your health care provider. Document Revised: 06/14/2017 Document Reviewed: 06/14/2017 Elsevier Patient Education  [place]. Upper Endoscopy, Adult, Care After This sheet gives you information about how to care for yourself after your procedure. Your health care provider may also give you more specific instructions. If you have problems or questions, contact your health care provider. What can I expect after the procedure? After the procedure, it is common to have:  A sore throat.  Mild stomach pain or discomfort.  Bloating.  Nausea. Follow these instructions at home:   Follow instructions from your health care provider about what to eat or drink after your procedure.  Return to your normal activities as told by your health care provider. Ask your health care provider what activities are safe for you.  Take over-the-counter and prescription medicines only as told by your health care provider.  Do not drive for 24 hours if you were given a sedative during your procedure.  Keep all follow-up visits as told by your health care provider. This is important. Contact a health care provider if you have:  A sore throat that lasts longer than one day.  Trouble swallowing. Get help right away if:  You vomit blood or your vomit looks like coffee grounds.  You have: ? A fever. ? Bloody, black, or tarry stools. ? A severe sore throat or you cannot swallow. ? Difficulty breathing. ? Severe pain in your chest or  abdomen. Summary  After the procedure, it is common to have a sore throat, mild stomach discomfort, bloating, and nausea.  Do not drive for 24 hours if you were given a sedative during the procedure.  Follow instructions from your health care provider about what to eat or drink after your procedure.  Return to your normal activities as told by your health care provider. This information is not intended to replace advice given to you by your health care provider. Make sure you discuss any questions you have with your health care provider. Document Revised: 08/30/2017 Document Reviewed: 08/08/2017 Elsevier Patient Education  [place]. No aspirin or NSAIDs. Resume usual medications as before.  Please do not take 9 tablets of potassium every day.  You can take 1 tablet twice daily. Resume usual diet. No driving for 24 hours. Next esophagogastroduodenoscopy in 6 months.
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
## 2019-09-07 ENCOUNTER — Encounter (HOSPITAL_COMMUNITY): Payer: Self-pay | Admitting: Internal Medicine

## 2020-01-18 ENCOUNTER — Telehealth (INDEPENDENT_AMBULATORY_CARE_PROVIDER_SITE_OTHER): Payer: Self-pay | Admitting: Internal Medicine

## 2020-01-18 NOTE — Telephone Encounter (Signed)
I reviewed patient's blood work from 12/27/2019 with patient's wife Tammy. Patient is doing well.  He is riding his bike every day.  He has started working. He was also seen at [facility] last month.

## 2020-01-22 ENCOUNTER — Other Ambulatory Visit (INDEPENDENT_AMBULATORY_CARE_PROVIDER_SITE_OTHER): Payer: Self-pay | Admitting: *Deleted

## 2020-01-23 ENCOUNTER — Encounter (INDEPENDENT_AMBULATORY_CARE_PROVIDER_SITE_OTHER): Payer: Self-pay | Admitting: *Deleted

## 2020-01-29 ENCOUNTER — Other Ambulatory Visit (INDEPENDENT_AMBULATORY_CARE_PROVIDER_SITE_OTHER): Payer: Self-pay

## 2020-01-29 DIAGNOSIS — I8500 Esophageal varices without bleeding: Secondary | ICD-10-CM

## 2020-01-29 DIAGNOSIS — K7460 Unspecified cirrhosis of liver: Secondary | ICD-10-CM

## 2020-02-11 ENCOUNTER — Telehealth (INDEPENDENT_AMBULATORY_CARE_PROVIDER_SITE_OTHER): Payer: Self-pay | Admitting: *Deleted

## 2020-02-11 NOTE — Telephone Encounter (Signed)
Referring MD/PCP:tucker   Procedure:egd  Reason/Indication:esophageal varices, cirrhosis  Has patient had this procedure before?Yes, 08/2019 If so, when, by whom and where?   Is there a family history of colon cancer?no Who? What age when diagnosed?   Is patient diabetic?yes  Does patient have prosthetic heart valve or mechanical valve?no  Do you have a pacemaker/defibrillator?no  Has patient ever had endocarditis/atrial fibrillation?no  Does patient use oxygen?no  Has patient had joint replacement within last 12 months?no  Is patient constipated or do they take laxatives?no  Does patient have a history of alcohol/drug use?no  Is patient on blood thinner such as Coumadin, Plavix and/or Aspirin?no  Medications:xifaxin 550 mg bid, potassium 99 mg tid, pantoprazole 40 mg daily, nadolol 20 mg daily, metoclopramide 5 mg tid, metformin 1000 mg bid, vit d3 daily, tylenol  Allergies:nkda  Medication Adjustment per Dr Laural Golden:  Procedure date & time:03/19/20

## 2020-03-12 NOTE — Patient Instructions (Signed)
42    Your procedure is scheduled on: 03/19/2020  Report to [facility] at    7:30 AM.  Call this number if you have problems the morning of surgery: [phone]   Remember:   Follow instructions on letter from office regarding when to stop eating and drinking        No Smoking the day of procedure      Take these medicines the morning of surgery with A SIP OF WATER: Nadolol, pantoprazole and xifaxan if needed   Do not wear jewelry, make-up or nail polish.  Do not wear lotions, powders, or perfumes. You may wear deodorant.                Do not bring valuables to the hospital.  Contacts, dentures or bridgework may not be worn into surgery.  Leave suitcase in the car. After surgery it may be brought to your room.  For patients admitted to the hospital, checkout time is 11:00 AM the day of discharge.   Patients discharged the day of surgery will not be allowed to drive home. Upper Endoscopy, Adult Upper endoscopy is a procedure to look inside the upper GI (gastrointestinal) tract. The upper GI tract is made up of:  The part of the body that moves food from your mouth to your stomach (esophagus).  The stomach.  The first part of your small intestine (duodenum). This procedure is also called esophagogastroduodenoscopy (EGD) or gastroscopy. In this procedure, your health care provider passes a thin, flexible tube (endoscope) through your mouth and down your esophagus into your stomach. A small camera is attached to the end of the tube. Images from the camera appear on a monitor in the exam room. During this procedure, your health care provider may also remove a small piece of tissue to be sent to a lab and examined under a microscope (biopsy). Your health care provider may do an upper endoscopy to diagnose cancers of the upper GI tract. You may also have this procedure to find the cause of other conditions, such as:  Stomach pain.  Heartburn.  Pain or problems when swallowing.  Nausea  and vomiting.  Stomach bleeding.  Stomach ulcers. Tell a health care provider about:  Any allergies you have.  All medicines you are taking, including vitamins, herbs, eye drops, creams, and over-the-counter medicines.  Any problems you or family members have had with anesthetic medicines.  Any blood disorders you have.  Any surgeries you have had.  Any medical conditions you have.  Whether you are pregnant or may be pregnant. What are the risks? Generally, this is a safe procedure. However, problems may occur, including:  Infection.  Bleeding.  Allergic reactions to medicines.  A tear or hole (perforation) in the esophagus, stomach, or duodenum. What happens before the procedure? Staying hydrated Follow instructions from your health care provider about hydration, which may include:  Up to 2 hours before the procedure - you may continue to drink clear liquids, such as water, clear fruit juice, black coffee, and plain tea.  Eating and drinking restrictions Follow instructions from your health care provider about eating and drinking, which may include:  8 hours before the procedure - stop eating heavy meals or foods, such as meat, fried foods, or fatty foods.  6 hours before the procedure - stop eating light meals or foods, such as toast or cereal.  6 hours before the procedure - stop drinking milk or drinks that contain milk.  2 hours  before the procedure - stop drinking clear liquids. Medicines Ask your health care provider about:  Changing or stopping your regular medicines. This is especially important if you are taking diabetes medicines or blood thinners.  Taking medicines such as aspirin and ibuprofen. These medicines can thin your blood. Do not take these medicines unless your health care provider tells you to take them.  Taking over-the-counter medicines, vitamins, herbs, and supplements. General instructions  Plan to have someone take you home from the  hospital or clinic.  If you will be going home right after the procedure, plan to have someone with you for 24 hours.  Ask your health care provider what steps will be taken to help prevent infection. What happens during the procedure?  1. An IV will be inserted into one of your veins. 2. You may be given one or more of the following: ? A medicine to help you relax (sedative). ? A medicine to numb the throat (local anesthetic). 3. You will lie on your left side on an exam table. 4. Your health care provider will pass the endoscope through your mouth and down your esophagus. 5. Your health care provider will use the scope to check the inside of your esophagus, stomach, and duodenum. Biopsies may be taken. 6. The endoscope will be removed. The procedure may vary among health care providers and hospitals. What happens after the procedure?  Your blood pressure, heart rate, breathing rate, and blood oxygen level will be monitored until you leave the hospital or clinic.  Do not drive for 24 hours if you were given a sedative during your procedure.  When your throat is no longer numb, you may be given some fluids to drink.  It is up to you to get the results of your procedure. Ask your health care provider, or the department that is doing the procedure, when your results will be ready. Summary  Upper endoscopy is a procedure to look inside the upper GI tract.  During the procedure, an IV will be inserted into one of your veins. You may be given a medicine to help you relax.  A medicine will be used to numb your throat.  The endoscope will be passed through your mouth and down your esophagus. This information is not intended to replace advice given to you by your health care provider. Make sure you discuss any questions you have with your health care provider. Document Revised: 08/31/2017 Document Reviewed: 08/08/2017 Elsevier Patient Education  [place] After  Please read the instructions outlined below and refer to this sheet in the next few weeks. These discharge instructions provide you with general information on caring for yourself after you leave the hospital. Your  doctor may also give you specific instructions. While your treatment has been planned according to the most current medical practices available, unavoidable complications occasionally occur. If you have any problems or questions after discharge, please call your doctor. HOME CARE INSTRUCTIONS Activity  You may resume your regular activity but move at a slower pace for the next 24 hours.   Take frequent rest periods for the next 24 hours.   Walking will help expel (get rid of) the air and reduce the bloated feeling in your abdomen.   No driving for 24 hours (because of the anesthesia (medicine) used during the test).   You may shower.   Do not sign any important legal documents or operate any machinery for 24 hours (because of the anesthesia used during the test).  Nutrition  Drink plenty of fluids.   You may resume your normal diet.   Begin with a light meal and progress to your normal diet.   Avoid alcoholic beverages for 24 hours or as instructed by your caregiver.  Medications You may resume your normal medications unless your caregiver tells you otherwise. What you can expect today  You may experience abdominal discomfort such as a feeling of fullness or "gas" pains.   You may experience a sore throat for 2 to 3 days. This is normal. Gargling with salt water may help this.  Follow-up Your doctor will discuss the results of your test with you. SEEK IMMEDIATE MEDICAL CARE IF:  You have excessive nausea (feeling sick to your stomach) and/or vomiting.   You have severe abdominal pain and distention (swelling).   You have trouble  swallowing.   You have a temperature over 100 F (37.8 C).   You have rectal bleeding or vomiting of blood.  Document Released: 10/21/2003 Document Revised: 02/25/2011 Document Reviewed: 05/03/2007

## 2020-03-18 ENCOUNTER — Other Ambulatory Visit (HOSPITAL_COMMUNITY)
Admission: RE | Admit: 2020-03-18 | Discharge: 2020-03-18 | Disposition: A | Discharge status: Home / Self Care | Payer: BC Managed Care – PPO | Source: Ambulatory Visit | Attending: Internal Medicine | Admitting: Internal Medicine

## 2020-03-18 ENCOUNTER — Other Ambulatory Visit: Payer: Self-pay

## 2020-03-18 ENCOUNTER — Encounter (HOSPITAL_COMMUNITY)
Admission: RE | Admit: 2020-03-18 | Discharge: 2020-03-18 | Disposition: A | Discharge status: Home / Self Care | Payer: BC Managed Care – PPO | Source: Ambulatory Visit | Attending: Internal Medicine | Admitting: Internal Medicine

## 2020-03-18 ENCOUNTER — Encounter (HOSPITAL_COMMUNITY): Payer: Self-pay

## 2020-03-18 DIAGNOSIS — I8500 Esophageal varices without bleeding: Secondary | ICD-10-CM

## 2020-03-18 DIAGNOSIS — Z20822 Contact with and (suspected) exposure to covid-19: Secondary | ICD-10-CM | POA: Diagnosis not present

## 2020-03-18 DIAGNOSIS — Z79899 Other long term (current) drug therapy: Secondary | ICD-10-CM | POA: Diagnosis not present

## 2020-03-18 DIAGNOSIS — K2289 Other specified disease of esophagus: Secondary | ICD-10-CM | POA: Diagnosis not present

## 2020-03-18 DIAGNOSIS — K7581 Nonalcoholic steatohepatitis (NASH): Secondary | ICD-10-CM | POA: Diagnosis not present

## 2020-03-18 DIAGNOSIS — Z7984 Long term (current) use of oral hypoglycemic drugs: Secondary | ICD-10-CM | POA: Diagnosis not present

## 2020-03-18 DIAGNOSIS — K7460 Unspecified cirrhosis of liver: Secondary | ICD-10-CM | POA: Insufficient documentation

## 2020-03-18 DIAGNOSIS — I8510 Secondary esophageal varices without bleeding: Secondary | ICD-10-CM | POA: Diagnosis present

## 2020-03-18 DIAGNOSIS — K766 Portal hypertension: Secondary | ICD-10-CM | POA: Diagnosis not present

## 2020-03-18 DIAGNOSIS — K317 Polyp of stomach and duodenum: Secondary | ICD-10-CM | POA: Diagnosis not present

## 2020-03-18 DIAGNOSIS — Z01812 Encounter for preprocedural laboratory examination: Secondary | ICD-10-CM | POA: Insufficient documentation

## 2020-03-18 DIAGNOSIS — Z800 Family history of malignant neoplasm of digestive organs: Secondary | ICD-10-CM | POA: Diagnosis not present

## 2020-03-18 HISTORY — DX: Sleep apnea, unspecified: G47.30

## 2020-03-18 LAB — COMPREHENSIVE METABOLIC PANEL
ALT: 29 U/L (ref 0–44)
AST: 38 U/L (ref 15–41)
Albumin: 2.9 g/dL — ABNORMAL LOW (ref 3.5–5.0)
Alkaline Phosphatase: 71 U/L (ref 38–126)
Anion gap: 8 (ref 5–15)
BUN: 8 mg/dL (ref 6–20)
CO2: 23 mmol/L (ref 22–32)
Calcium: 8.5 mg/dL — ABNORMAL LOW (ref 8.9–10.3)
Chloride: 108 mmol/L (ref 98–111)
Creatinine, Ser: 0.6 mg/dL — ABNORMAL LOW (ref 0.61–1.24)
GFR, Estimated: >60 mL/min (ref >60)
Glucose, Bld: 101 mg/dL — ABNORMAL HIGH (ref 70–99)
Potassium: 3.5 mmol/L (ref 3.5–5.1)
Sodium: 139 mmol/L (ref 135–145)
Total Bilirubin: 3.2 mg/dL — ABNORMAL HIGH (ref 0.3–1.2)
Total Protein: 6.5 g/dL (ref 6.5–8.1)

## 2020-03-18 LAB — CBC WITH DIFFERENTIAL/PLATELET
Abs Immature Granulocytes: 0 10*3/uL (ref 0.00–0.07)
Basophils Absolute: 0 10*3/uL (ref 0.0–0.1)
Basophils Relative: 1 %
Eosinophils Absolute: 0.2 10*3/uL (ref 0.0–0.5)
Eosinophils Relative: 7 %
HCT: 32.4 % — ABNORMAL LOW (ref 39.0–52.0)
Hemoglobin: 10.3 g/dL — ABNORMAL LOW (ref 13.0–17.0)
Immature Granulocytes: 0 %
Lymphocytes Relative: 24 %
Lymphs Abs: 0.5 10*3/uL — ABNORMAL LOW (ref 0.7–4.0)
MCH: 28.6 pg (ref 26.0–34.0)
MCHC: 31.8 g/dL (ref 30.0–36.0)
MCV: 90 fL (ref 80.0–100.0)
Monocytes Absolute: 0.3 10*3/uL (ref 0.1–1.0)
Monocytes Relative: 14 %
Neutro Abs: 1.2 10*3/uL — ABNORMAL LOW (ref 1.7–7.7)
Neutrophils Relative %: 54 %
Platelets: 52 10*3/uL — ABNORMAL LOW (ref 150–400)
RBC: 3.6 MIL/uL — ABNORMAL LOW (ref 4.22–5.81)
RDW: 16.9 % — ABNORMAL HIGH (ref 11.5–15.5)
WBC: 2.1 10*3/uL — ABNORMAL LOW (ref 4.0–10.5)
nRBC: 0 % (ref 0.0–0.2)

## 2020-03-18 LAB — PROTIME-INR
INR: 1.5 — ABNORMAL HIGH (ref 0.8–1.2)
Prothrombin Time: 17.5 seconds — ABNORMAL HIGH (ref 11.4–15.2)

## 2020-03-19 ENCOUNTER — Ambulatory Visit (HOSPITAL_COMMUNITY)
Admission: RE | Admit: 2020-03-19 | Discharge: 2020-03-19 | Disposition: A | Discharge status: Home / Self Care | Payer: BC Managed Care – PPO | Source: Ambulatory Visit | Attending: Internal Medicine | Admitting: Internal Medicine

## 2020-03-19 ENCOUNTER — Encounter (HOSPITAL_COMMUNITY)
Admission: RE | Disposition: A | Discharge status: Home / Self Care | Payer: Self-pay | Source: Ambulatory Visit | Attending: Internal Medicine

## 2020-03-19 ENCOUNTER — Ambulatory Visit (HOSPITAL_COMMUNITY): Payer: BC Managed Care – PPO | Admitting: Anesthesiology

## 2020-03-19 ENCOUNTER — Encounter (HOSPITAL_COMMUNITY): Payer: Self-pay | Admitting: Internal Medicine

## 2020-03-19 ENCOUNTER — Other Ambulatory Visit (INDEPENDENT_AMBULATORY_CARE_PROVIDER_SITE_OTHER): Payer: Self-pay | Admitting: Internal Medicine

## 2020-03-19 DIAGNOSIS — K2289 Other specified disease of esophagus: Secondary | ICD-10-CM | POA: Diagnosis not present

## 2020-03-19 DIAGNOSIS — K317 Polyp of stomach and duodenum: Secondary | ICD-10-CM | POA: Insufficient documentation

## 2020-03-19 DIAGNOSIS — I8510 Secondary esophageal varices without bleeding: Secondary | ICD-10-CM | POA: Insufficient documentation

## 2020-03-19 DIAGNOSIS — K3189 Other diseases of stomach and duodenum: Secondary | ICD-10-CM | POA: Diagnosis not present

## 2020-03-19 DIAGNOSIS — K7581 Nonalcoholic steatohepatitis (NASH): Secondary | ICD-10-CM | POA: Insufficient documentation

## 2020-03-19 DIAGNOSIS — I8500 Esophageal varices without bleeding: Secondary | ICD-10-CM | POA: Diagnosis not present

## 2020-03-19 DIAGNOSIS — Z79899 Other long term (current) drug therapy: Secondary | ICD-10-CM | POA: Insufficient documentation

## 2020-03-19 DIAGNOSIS — D649 Anemia, unspecified: Secondary | ICD-10-CM

## 2020-03-19 DIAGNOSIS — K766 Portal hypertension: Secondary | ICD-10-CM

## 2020-03-19 DIAGNOSIS — Z7984 Long term (current) use of oral hypoglycemic drugs: Secondary | ICD-10-CM | POA: Insufficient documentation

## 2020-03-19 DIAGNOSIS — Z800 Family history of malignant neoplasm of digestive organs: Secondary | ICD-10-CM | POA: Insufficient documentation

## 2020-03-19 DIAGNOSIS — K7460 Unspecified cirrhosis of liver: Secondary | ICD-10-CM

## 2020-03-19 DIAGNOSIS — Z20822 Contact with and (suspected) exposure to covid-19: Secondary | ICD-10-CM | POA: Insufficient documentation

## 2020-03-19 HISTORY — PX: ESOPHAGEAL BANDING: SHX5518

## 2020-03-19 HISTORY — PX: ESOPHAGOGASTRODUODENOSCOPY (EGD) WITH PROPOFOL: SHX5813

## 2020-03-19 LAB — IRON AND TIBC
Iron: 50 ug/dL (ref 45–182)
Saturation Ratios: 13 % — ABNORMAL LOW (ref 17.9–39.5)
TIBC: 395 ug/dL (ref 250–450)
UIBC: 345 ug/dL

## 2020-03-19 LAB — FERRITIN: Ferritin: 12 ng/mL — ABNORMAL LOW (ref 24–336)

## 2020-03-19 LAB — VITAMIN B12: Vitamin B-12: 640 pg/mL (ref 180–914)

## 2020-03-19 LAB — SARS CORONAVIRUS 2 (TAT 6-24 HRS): SARS Coronavirus 2: NEGATIVE

## 2020-03-19 LAB — GLUCOSE, CAPILLARY
Glucose-Capillary: 76 mg/dL (ref 70–99)
Glucose-Capillary: 80 mg/dL (ref 70–99)

## 2020-03-19 SURGERY — ESOPHAGOGASTRODUODENOSCOPY (EGD) WITH PROPOFOL
Anesthesia: General

## 2020-03-19 MED ORDER — LACTATED RINGERS IV SOLN
INTRAVENOUS | Status: DC
  Administered 2020-03-19: 08:00:00 1000 mL via INTRAVENOUS

## 2020-03-19 MED ORDER — FERROUS SULFATE 325 (65 FE) MG PO TABS: 325.0000 mg | ORAL_TABLET | Freq: Every day | ORAL | 0 refills | Status: AC

## 2020-03-19 MED ORDER — STERILE WATER FOR IRRIGATION IR SOLN
Status: DC | PRN
  Administered 2020-03-19: 09:00:00 100 mL

## 2020-03-19 MED ORDER — CHLORHEXIDINE GLUCONATE CLOTH 2 % EX PADS: 6.0000 | MEDICATED_PAD | Freq: Once | CUTANEOUS | Status: DC

## 2020-03-19 MED ORDER — PROPOFOL 10 MG/ML IV BOLUS
INTRAVENOUS | Status: AC
  Filled 2020-03-19: qty 40

## 2020-03-19 MED ORDER — LIDOCAINE VISCOUS HCL 2 % MT SOLN
OROMUCOSAL | Status: AC
  Filled 2020-03-19: qty 15

## 2020-03-19 MED ORDER — PROPOFOL 10 MG/ML IV BOLUS
INTRAVENOUS | Status: DC | PRN
  Administered 2020-03-19: 175 ug/kg/min via INTRAVENOUS
  Administered 2020-03-19: 100 mg via INTRAVENOUS

## 2020-03-19 MED ORDER — POTASSIUM 99 MG PO TABS: 1.0000 | ORAL_TABLET | Freq: Every day | ORAL | 0 refills | Status: AC

## 2020-03-19 MED ORDER — LIDOCAINE HCL (CARDIAC) PF 100 MG/5ML IV SOSY
PREFILLED_SYRINGE | INTRAVENOUS | Status: DC | PRN
  Administered 2020-03-19: 100 mg via INTRAVENOUS

## 2020-03-19 MED ORDER — LIDOCAINE VISCOUS HCL 2 % MT SOLN
15.0000 mL | Freq: Once | OROMUCOSAL | Status: AC
  Administered 2020-03-19: 08:00:00 15 mL via OROMUCOSAL

## 2020-03-19 MED ORDER — GLYCOPYRROLATE 0.2 MG/ML IJ SOLN
INTRAMUSCULAR | Status: AC
  Filled 2020-03-19: qty 1

## 2020-03-19 MED ORDER — GLYCOPYRROLATE 0.2 MG/ML IJ SOLN
0.2000 mg | Freq: Once | INTRAMUSCULAR | Status: AC
  Administered 2020-03-19: 08:00:00 0.2 mg via INTRAVENOUS

## 2020-03-19 NOTE — Discharge Instructions (Signed)
Resume usual medications and diet as before No driving for 24 hours. Physician will call with results of iron TIBC saturation  ferritin and B12 levels. Repeat upper endoscopy in 6 months  *******Message left at office to schedule future endoscopy*******      Upper Endoscopy, Adult, Care After This sheet gives you information about how to care for yourself after your procedure. Your health care provider may also give you more specific instructions. If you have problems or questions, contact your health care provider. What can I expect after the procedure? After the procedure, it is common to have:  A sore throat.  Mild stomach pain or discomfort.  Bloating.  Nausea. Follow these instructions at home:   Follow instructions from your health care provider about what to eat or drink after your procedure.  Return to your normal activities as told by your health care provider. Ask your health care provider what activities are safe for you.  Take over-the-counter and prescription medicines only as told by your health care provider.  Do not drive for 24 hours if you were given a sedative during your procedure.  Keep all follow-up visits as told by your health care provider. This is important. Contact a health care provider if you have:  A sore throat that lasts longer than one day.  Trouble swallowing. Get help right away if:  You vomit blood or your vomit looks like coffee grounds.  You have: ? A fever. ? Bloody, black, or tarry stools. ? A severe sore throat or you cannot swallow. ? Difficulty breathing. ? Severe pain in your chest or abdomen. Summary  After the procedure, it is common to have a sore throat, mild stomach discomfort, bloating, and nausea.  Do not drive for 24 hours if you were given a sedative during the procedure.  Follow instructions from your health care provider about what to eat or drink after your procedure.  Return to your normal activities as  told by your health care provider. This information is not intended to replace advice given to you by your health care provider. Make sure you discuss any questions you have with your health care provider. Document Revised: 08/30/2017 Document Reviewed: 08/08/2017 Elsevier Patient Education  2020 [place] After These instructions provide you with information about caring for yourself after your procedure. Your health care provider may also give you more specific instructions. Your treatment has been planned according to current medical practices, but problems sometimes occur. Call your health care provider if you have any problems or questions after your procedure. What can I expect after the procedure? After your procedure, you may:  Feel sleepy for several hours.  Feel clumsy and have poor balance for several hours.  Feel forgetful about what happened after the procedure.  Have poor judgment for several hours.  Feel nauseous or vomit.  Have a sore throat if you had a breathing tube during the procedure. Follow these instructions at home: For at least 24 hours after the procedure:      Have a responsible adult stay with you. It is important to have someone help care for you until you are awake and alert.  Rest as needed.  Do not: ? Participate in activities in which you could fall or become injured. ? Drive. ? Use heavy machinery. ? Drink alcohol. ? Take sleeping pills or medicines that cause drowsiness. ? Make important decisions or sign legal documents. ? Take care of  children on your own. Eating and drinking  Follow the diet that is recommended by your health care provider.  If you vomit, drink water, juice, or soup when you can drink without vomiting.  Make sure you have little or no nausea before eating solid foods. General instructions  Take over-the-counter and prescription medicines only as told by your health care  provider.  If you have sleep apnea, surgery and certain medicines can increase your risk for breathing problems. Follow instructions from your health care provider about wearing your sleep device: ? Anytime you are sleeping, including during daytime naps. ? While taking prescription pain medicines, sleeping medicines, or medicines that make you drowsy.  If you smoke, do not smoke without supervision.  Keep all follow-up visits as told by your health care provider. This is important. Contact a health care provider if:  You keep feeling nauseous or you keep vomiting.  You feel light-headed.  You develop a rash.  You have a fever. Get help right away if:  You have trouble breathing. Summary  For several hours after your procedure, you may feel sleepy and have poor judgment.  Have a responsible adult stay with you for at least 24 hours or until you are awake and alert. This information is not intended to replace advice given to you by your health care provider. Make sure you discuss any questions you have with your health care provider. Document Revised: 06/06/2017 Document Reviewed: 06/29/2015 Elsevier Patient Education  [place].

## 2020-03-19 NOTE — Transfer of Care (Signed)
Immediate Anesthesia Transfer of Care Note  Patient: [facility]  Procedure(s) Performed: ESOPHAGOGASTRODUODENOSCOPY (EGD) WITH PROPOFOL (N/A ) ESOPHAGEAL BANDING  Patient Location: PACU  Anesthesia Type:General  Level of Consciousness: awake, oriented and drowsy  Airway & Oxygen Therapy: Patient Spontanous Breathing  Post-op Assessment: Report given to RN, Post -op Vital signs reviewed and stable and Patient moving all extremities  Post vital signs: Reviewed and stable  Last Vitals:  Vitals Value Taken Time  BP    Temp    Pulse 80 03/19/20 0929  Resp 15 03/19/20 0929  SpO2 95 % 03/19/20 0929  Vitals shown include unvalidated device data.  Last Pain:  Vitals:   03/19/20 0907  TempSrc:   PainSc: 0-No pain      Patients Stated Pain Goal: 8 (88/91/69 4503)  Complications: No complications documented.

## 2020-03-19 NOTE — Anesthesia Preprocedure Evaluation (Signed)
Anesthesia Evaluation  Patient identified by MRN, date of birth, ID band Patient awake    Reviewed: Allergy & Precautions, H&P , NPO status , Patient's Chart, lab work & pertinent test results, reviewed documented beta blocker date and time   Airway Mallampati: II  TM Distance: >3 FB Neck ROM: full    Dental no notable dental hx.    Pulmonary sleep apnea ,    Pulmonary exam normal breath sounds clear to auscultation       Cardiovascular Exercise Tolerance: Good hypertension,  Rhythm:regular Rate:Normal     Neuro/Psych negative neurological ROS  negative psych ROS   GI/Hepatic negative GI ROS, (+)   Esophageal Varices    , Hepatitis -  Endo/Other  negative endocrine ROSdiabetes  Renal/GU negative Renal ROS  negative genitourinary   Musculoskeletal   Abdominal   Peds  Hematology negative hematology ROS (+)   Anesthesia Other Findings   Reproductive/Obstetrics negative OB ROS                             Anesthesia Physical Anesthesia Plan  ASA: III  Anesthesia Plan: General   Post-op Pain Management:    Induction:   PONV Risk Score and Plan: Propofol infusion  Airway Management Planned:   Additional Equipment:   Intra-op Plan:   Post-operative Plan:   Informed Consent: I have reviewed the patients History and Physical, chart, labs and discussed the procedure including the risks, benefits and alternatives for the proposed anesthesia with the patient or authorized representative who has indicated his/her understanding and acceptance.     Dental Advisory Given  Plan Discussed with: CRNA  Anesthesia Plan Comments:         Anesthesia Quick Evaluation

## 2020-03-19 NOTE — Anesthesia Postprocedure Evaluation (Signed)
Anesthesia Post Note  Patient: [facility]  Procedure(s) Performed: ESOPHAGOGASTRODUODENOSCOPY (EGD) WITH PROPOFOL (N/A ) ESOPHAGEAL BANDING  Patient location during evaluation: PACU Anesthesia Type: General Level of consciousness: awake, oriented, awake and alert and patient cooperative Pain management: pain level controlled Vital Signs Assessment: post-procedure vital signs reviewed and stable Respiratory status: spontaneous breathing, respiratory function stable and nonlabored ventilation Cardiovascular status: blood pressure returned to baseline and stable Postop Assessment: no headache and no backache Anesthetic complications: no   No complications documented.   Last Vitals:  Vitals:   03/19/20 0756  BP: 140/83  Resp: 18  Temp: 36.7 C  SpO2: 97%    Last Pain:  Vitals:   03/19/20 0907  TempSrc:   PainSc: 0-No pain                 Tacy Learn

## 2020-03-19 NOTE — Op Note (Signed)
[facility] Patient Name: [facility] Procedure Date: 03/19/2020 8:55 AM MRN: [id number] Date of Birth: [birth year] Attending MD: Hildred Laser , MD CSN: [id number] Age: 59 Admit Type: Outpatient Procedure:                Upper GI endoscopy Indications:              Esophageal varices, For therapy of esophageal                            varices Providers:                Hildred Laser, MD, Crystal Page, Raphael Gibney,                            Technician Referring MD:              Medicines:                Propofol per Anesthesia Complications:            No immediate complications. Estimated Blood Loss:     Estimated blood loss: none. Procedure:                Pre-Anesthesia Assessment:                           - Prior to the procedure, a History and Physical                            was performed, and patient medications and                            allergies were reviewed. The patient's tolerance of                            previous anesthesia was also reviewed. The risks                            and benefits of the procedure and the sedation                            options and risks were discussed with the patient.                            All questions were answered, and informed consent                            was obtained. Prior Anticoagulants: The patient has                            taken no previous anticoagulant or antiplatelet                            agents. ASA Grade Assessment: III - A patient with                            severe systemic disease.  After reviewing the risks                            and benefits, the patient was deemed in                            satisfactory condition to undergo the procedure.                           After obtaining informed consent, the endoscope was                            passed under direct vision. Throughout the                            procedure, the patient's blood pressure, pulse, and                             oxygen saturations were monitored continuously. The                            [id number]-[id number] ([id number]) scope was introduced through the                            mouth, and advanced to the second part of duodenum.                            The upper GI endoscopy was accomplished without                            difficulty. The patient tolerated the procedure                            well. Scope In: 9:12:06 AM Scope Out: 9:25:06 AM Total Procedure Duration: 0 hours 13 minutes 0 seconds  Findings:      The hypopharynx was normal.      The proximal esophagus was normal.      A post variceal banding scar was found in the mid esophagus and in the       distal esophagus. The scar tissue was healthy in appearance.      Grade II varix with red marking was found in the distal esophagus. One       band was successfully placed with complete eradication, resulting in       deflation of varices.      The Z-line was regular and was found 39 cm from the incisors.      Moderate portal hypertensive gastropathy was found in the entire       examined stomach.      A few small sessile polyps with no stigmata of recent bleeding were       found in the gastric fundus and in the gastric body.      The duodenal bulb and second portion of the duodenum were normal. Impression:               - Normal hypopharynx.                           -  Normal proximal esophagus.                           - Scar in the mid esophagus and in the distal                            esophagus.                           - Grade II esophageal varices. Completely                            eradicated. Banded.                           - Z-line regular, 39 cm from the incisors.                           - Portal hypertensive gastropathy.                           - A few gastric polyps.                           - Normal duodenal bulb and second portion of the                            duodenum.                            - No specimens collected. Moderate Sedation:      Per Anesthesia Care Recommendation:           - Patient has a contact number available for                            emergencies. The signs and symptoms of potential                            delayed complications were discussed with the                            patient. Return to normal activities tomorrow.                            Written discharge instructions were provided to the                            patient.                           - Resume previous diet today.                           - Continue present medications.                           -Check serum iron, TIBC ferritin  and B12 levels                            todoy(hemoglobin has dropped to 10.3).                           - Repeat upper endoscopy in 6 months. Procedure Code(s):        --- Professional ---                           [phone], Esophagogastroduodenoscopy, flexible,                            transoral; with band ligation of esophageal/gastric                            varices Diagnosis Code(s):        --- Professional ---                           K22.8, Other specified diseases of esophagus                           I85.00, Esophageal varices without bleeding                           K76.6, Portal hypertension                           K31.89, Other diseases of stomach and duodenum                           K31.7, Polyp of stomach and duodenum CPT copyright 2019 [employer]. All rights reserved. The codes documented in this report are preliminary and upon coder review may  be revised to meet current compliance requirements. Hildred Laser, MD Hildred Laser, MD 03/19/2020 9:39:17 AM This report has been signed electronically. Number of Addenda: 0
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
## 2020-03-25 ENCOUNTER — Encounter (HOSPITAL_COMMUNITY): Payer: Self-pay | Admitting: Internal Medicine

## 2020-04-22 ENCOUNTER — Encounter (INDEPENDENT_AMBULATORY_CARE_PROVIDER_SITE_OTHER): Payer: Self-pay

## 2020-07-10 ENCOUNTER — Encounter (INDEPENDENT_AMBULATORY_CARE_PROVIDER_SITE_OTHER): Payer: Self-pay

## 2020-07-17 ENCOUNTER — Other Ambulatory Visit (INDEPENDENT_AMBULATORY_CARE_PROVIDER_SITE_OTHER): Payer: Self-pay | Admitting: *Deleted

## 2020-07-18 ENCOUNTER — Other Ambulatory Visit (INDEPENDENT_AMBULATORY_CARE_PROVIDER_SITE_OTHER): Payer: Self-pay

## 2020-07-18 DIAGNOSIS — I8500 Esophageal varices without bleeding: Secondary | ICD-10-CM

## 2020-08-22 ENCOUNTER — Encounter (INDEPENDENT_AMBULATORY_CARE_PROVIDER_SITE_OTHER): Payer: Self-pay | Admitting: *Deleted

## 2020-09-12 ENCOUNTER — Telehealth (INDEPENDENT_AMBULATORY_CARE_PROVIDER_SITE_OTHER): Payer: Self-pay | Admitting: *Deleted

## 2020-09-12 NOTE — Telephone Encounter (Signed)
Referring MD/PCP: tucker  Procedure: egd w propofol  Reason/Indication:  esophageal varices, cirrhosis  Has patient had this procedure before?  Yes, 02/2020  If so, when, by whom and where?    Is there a family history of colon cancer?    Who?  What age when diagnosed?    Is patient diabetic? If yes, Type 1 or Type 2   yes      Does patient have prosthetic heart valve or mechanical valve?  no  Do you have a pacemaker/defibrillator?  no  Has patient ever had endocarditis/atrial fibrillation? no  Does patient use oxygen? no  Has patient had joint replacement within last 12 months?  no  Is patient constipated or do they take laxatives? no  Does patient have a history of alcohol/drug use?  no  Have you had a stroke/heart attack last 6 mths? no  Do you take medicine for weight loss?  no  For male patients,: do you still have your menstrual cycle? N/a  Is patient on blood thinner such as Coumadin, Plavix and/or Aspirin? no  Medications: spiraonolactone 100 mg daily, lactulose 15 mil tid, lasix 40 mg daily, tylenol prn, calcium 600 mg bid, ferrous sulfate 325 mg daily, magnesium 250 mg bid, metformin 1000 mg bid, nadolol 20 mg daily, pantoprazole 40 mg daily, potassium 99 mg daily, xifaxan 550 mg bid  Allergies: nkda  Medication Adjustment per Dr Rehman/Dr Jenetta Downer   Procedure date & time: 10/01/20

## 2020-09-26 NOTE — Patient Instructions (Signed)
Shawn Ward  09/26/2020     @PREFPERIOPPHARMACY @   Your procedure is scheduled on  10/01/2020.   Report to [facility] at  [phone]  A.M.   Call this number if you have problems the morning of surgery:  [phone]   Remember:  Follow the diet instructions given to you by the office.  Take 7 units of Lantus the night before your procedure.  DO NOT take any medications for diabetes the morning of your procedure.     Take these medicines the morning of surgery with A SIP OF WATER        claritin, corgard, protonix, xifaxan.     Do not wear jewelry, make-up or nail polish.  Do not wear lotions, powders, or perfumes, or deodorant.  Do not shave 48 hours prior to surgery.  Men may shave face and neck.  Do not bring valuables to the hospital.  [facility] is not responsible for any belongings or valuables.  Contacts, dentures or bridgework may not be worn into surgery.  Leave your suitcase in the car.  After surgery it may be brought to your room.  For patients admitted to the hospital, discharge time will be determined by your treatment team.  Patients discharged the day of surgery will not be allowed to drive home and must have someone with them for 24 hours.    Special instructions:    DO NOT smoke tobacco or vape for 24 hours before your procedure.  Please read over the following fact sheets that you were given. Anesthesia Post-op Instructions and Care and Recovery After Surgery      Upper Endoscopy, Adult, Care After This sheet gives you information about how to care for yourself after your procedure. Your health care provider may also give you more specific instructions. If you have problems or questions, contact your health careprovider. What can I expect after the procedure? After the procedure, it is common to have: A sore throat. Mild stomach pain or discomfort. Bloating. Nausea. Follow these instructions at home:  Follow instructions from your health  care provider about what to eat or drink after your procedure. Return to your normal activities as told by your health care provider. Ask your health care provider what activities are safe for you. Take over-the-counter and prescription medicines only as told by your health care provider. If you were given a sedative during the procedure, it can affect you for several hours. Do not drive or operate machinery until your health care provider says that it is safe. Keep all follow-up visits as told by your health care provider. This is important. Contact a health care provider if you have: A sore throat that lasts longer than one day. Trouble swallowing. Get help right away if: You vomit blood or your vomit looks like coffee grounds. You have: A fever. Bloody, black, or tarry stools. A severe sore throat or you cannot swallow. Difficulty breathing. Severe pain in your chest or abdomen. Summary After the procedure, it is common to have a sore throat, mild stomach discomfort, bloating, and nausea. If you were given a sedative during the procedure, it can affect you for several hours. Do not drive or operate machinery until your health care provider says that it is safe. Follow instructions from your health care provider about what to eat or drink after your procedure. Return to your normal activities as told by your health care provider. This information is not intended to  replace advice given to you by your health care provider. Make sure you discuss any questions you have with your healthcare provider. Document Revised: 03/06/2019 Document Reviewed: 08/08/2017 Elsevier Patient Education  2022 [place] After This sheet gives you information about how to care for yourself after your procedure. Your health care provider may also give you more specific instructions. If you have problems or questions, contact your health careprovider. What can I expect after the  procedure? After the procedure, it is common to have: Tiredness. Forgetfulness about what happened after the procedure. Impaired judgment for important decisions. Nausea or vomiting. Some difficulty with balance. Follow these instructions at home: For the time period you were told by your health care provider:     Rest as needed. Do not participate in activities where you could fall or become injured. Do not drive or use machinery. Do not drink alcohol. Do not take sleeping pills or medicines that cause drowsiness. Do not make important decisions or sign legal documents. Do not take care of children on your own. Eating and drinking Follow the diet that is recommended by your health care provider. Drink enough fluid to keep your urine pale yellow. If you vomit: Drink water, juice, or soup when you can drink without vomiting. Make sure you have little or no nausea before eating solid foods. General instructions Have a responsible adult stay with you for the time you are told. It is important to have someone help care for you until you are awake and alert. Take over-the-counter and prescription medicines only as told by your health care provider. If you have sleep apnea, surgery and certain medicines can increase your risk for breathing problems. Follow instructions from your health care provider about wearing your sleep device: Anytime you are sleeping, including during daytime naps. While taking prescription pain medicines, sleeping medicines, or medicines that make you drowsy. Avoid smoking. Keep all follow-up visits as told by your health care provider. This is important. Contact a health care provider if: You keep feeling nauseous or you keep vomiting. You feel light-headed. You are still sleepy or having trouble with balance after 24 hours. You develop a rash. You have a fever. You have redness or swelling around the IV site. Get help right away if: You have trouble  breathing. You have new-onset confusion at home. Summary For several hours after your procedure, you may feel tired. You may also be forgetful and have poor judgment. Have a responsible adult stay with you for the time you are told. It is important to have someone help care for you until you are awake and alert. Rest as told. Do not drive or operate machinery. Do not drink alcohol or take sleeping pills. Get help right away if you have trouble breathing, or if you suddenly become confused. This information is not intended to replace advice given to you by your health care provider. Make sure you discuss any questions you have with your healthcare provider. Document Revised: 11/22/2019 Document Reviewed: 02/08/2019 Elsevier Patient Education  2022 [employer].

## 2020-09-29 ENCOUNTER — Other Ambulatory Visit (HOSPITAL_COMMUNITY): Payer: BC Managed Care – PPO

## 2020-09-29 ENCOUNTER — Encounter (HOSPITAL_COMMUNITY): Payer: Self-pay

## 2020-09-29 ENCOUNTER — Encounter (HOSPITAL_COMMUNITY)
Admission: RE | Admit: 2020-09-29 | Discharge: 2020-09-29 | Disposition: A | Discharge status: Home / Self Care | Payer: BC Managed Care – PPO | Source: Ambulatory Visit | Attending: Internal Medicine | Admitting: Internal Medicine

## 2020-09-29 ENCOUNTER — Other Ambulatory Visit: Payer: Self-pay

## 2020-09-29 DIAGNOSIS — I8510 Secondary esophageal varices without bleeding: Secondary | ICD-10-CM | POA: Diagnosis present

## 2020-09-29 DIAGNOSIS — Z20822 Contact with and (suspected) exposure to covid-19: Secondary | ICD-10-CM | POA: Insufficient documentation

## 2020-09-29 DIAGNOSIS — K317 Polyp of stomach and duodenum: Secondary | ICD-10-CM | POA: Diagnosis not present

## 2020-09-29 DIAGNOSIS — Z7984 Long term (current) use of oral hypoglycemic drugs: Secondary | ICD-10-CM | POA: Diagnosis not present

## 2020-09-29 DIAGNOSIS — K3189 Other diseases of stomach and duodenum: Secondary | ICD-10-CM | POA: Diagnosis not present

## 2020-09-29 DIAGNOSIS — Z794 Long term (current) use of insulin: Secondary | ICD-10-CM | POA: Diagnosis not present

## 2020-09-29 DIAGNOSIS — K7581 Nonalcoholic steatohepatitis (NASH): Secondary | ICD-10-CM | POA: Diagnosis not present

## 2020-09-29 DIAGNOSIS — Z01818 Encounter for other preprocedural examination: Secondary | ICD-10-CM | POA: Insufficient documentation

## 2020-09-29 DIAGNOSIS — K766 Portal hypertension: Secondary | ICD-10-CM | POA: Diagnosis not present

## 2020-09-29 DIAGNOSIS — K2289 Other specified disease of esophagus: Secondary | ICD-10-CM | POA: Diagnosis not present

## 2020-09-29 DIAGNOSIS — Z79899 Other long term (current) drug therapy: Secondary | ICD-10-CM | POA: Diagnosis not present

## 2020-09-29 DIAGNOSIS — K7460 Unspecified cirrhosis of liver: Secondary | ICD-10-CM | POA: Diagnosis not present

## 2020-09-29 DIAGNOSIS — I8500 Esophageal varices without bleeding: Secondary | ICD-10-CM

## 2020-09-29 HISTORY — DX: Other seasonal allergic rhinitis: J30.2

## 2020-09-29 HISTORY — DX: Gastro-esophageal reflux disease without esophagitis: K21.9

## 2020-09-29 HISTORY — DX: Fatty (change of) liver, not elsewhere classified: K76.0

## 2020-09-29 LAB — CBC WITH DIFFERENTIAL/PLATELET
Abs Immature Granulocytes: 0 10*3/uL (ref 0.00–0.07)
Basophils Absolute: 0 10*3/uL (ref 0.0–0.1)
Basophils Relative: 1 %
Eosinophils Absolute: 0.2 10*3/uL (ref 0.0–0.5)
Eosinophils Relative: 6 %
HCT: 36.7 % — ABNORMAL LOW (ref 39.0–52.0)
Hemoglobin: 12.9 g/dL — ABNORMAL LOW (ref 13.0–17.0)
Immature Granulocytes: 0 %
Lymphocytes Relative: 21 %
Lymphs Abs: 0.5 10*3/uL — ABNORMAL LOW (ref 0.7–4.0)
MCH: 36.4 pg — ABNORMAL HIGH (ref 26.0–34.0)
MCHC: 35.1 g/dL (ref 30.0–36.0)
MCV: 103.7 fL — ABNORMAL HIGH (ref 80.0–100.0)
Monocytes Absolute: 0.4 10*3/uL (ref 0.1–1.0)
Monocytes Relative: 15 %
Neutro Abs: 1.4 10*3/uL — ABNORMAL LOW (ref 1.7–7.7)
Neutrophils Relative %: 57 %
Platelets: 48 10*3/uL — ABNORMAL LOW (ref 150–400)
RBC: 3.54 MIL/uL — ABNORMAL LOW (ref 4.22–5.81)
RDW: 13.3 % (ref 11.5–15.5)
WBC: 2.5 10*3/uL — ABNORMAL LOW (ref 4.0–10.5)
nRBC: 0 % (ref 0.0–0.2)

## 2020-09-29 LAB — COMPREHENSIVE METABOLIC PANEL
ALT: 32 U/L (ref 0–44)
AST: 44 U/L — ABNORMAL HIGH (ref 15–41)
Albumin: 2.9 g/dL — ABNORMAL LOW (ref 3.5–5.0)
Alkaline Phosphatase: 62 U/L (ref 38–126)
Anion gap: 8 (ref 5–15)
BUN: 9 mg/dL (ref 6–20)
CO2: 23 mmol/L (ref 22–32)
Calcium: 8.5 mg/dL — ABNORMAL LOW (ref 8.9–10.3)
Chloride: 103 mmol/L (ref 98–111)
Creatinine, Ser: 0.88 mg/dL (ref 0.61–1.24)
GFR, Estimated: >60 mL/min (ref >60)
Glucose, Bld: 273 mg/dL — ABNORMAL HIGH (ref 70–99)
Potassium: 3.8 mmol/L (ref 3.5–5.1)
Sodium: 134 mmol/L — ABNORMAL LOW (ref 135–145)
Total Bilirubin: 4.8 mg/dL — ABNORMAL HIGH (ref 0.3–1.2)
Total Protein: 6.2 g/dL — ABNORMAL LOW (ref 6.5–8.1)

## 2020-09-29 LAB — PROTIME-INR
INR: 1.4 — ABNORMAL HIGH (ref 0.8–1.2)
Prothrombin Time: 17.4 seconds — ABNORMAL HIGH (ref 11.4–15.2)

## 2020-10-01 ENCOUNTER — Encounter (HOSPITAL_COMMUNITY): Payer: Self-pay | Admitting: Internal Medicine

## 2020-10-01 ENCOUNTER — Other Ambulatory Visit: Payer: Self-pay

## 2020-10-01 ENCOUNTER — Ambulatory Visit (HOSPITAL_COMMUNITY)
Admission: RE | Admit: 2020-10-01 | Discharge: 2020-10-01 | Disposition: A | Discharge status: Home / Self Care | Payer: BC Managed Care – PPO | Source: Ambulatory Visit | Attending: Internal Medicine | Admitting: Internal Medicine

## 2020-10-01 ENCOUNTER — Ambulatory Visit (HOSPITAL_COMMUNITY): Payer: BC Managed Care – PPO | Admitting: Anesthesiology

## 2020-10-01 ENCOUNTER — Encounter (HOSPITAL_COMMUNITY)
Admission: RE | Disposition: A | Discharge status: Home / Self Care | Payer: Self-pay | Source: Ambulatory Visit | Attending: Internal Medicine

## 2020-10-01 DIAGNOSIS — K7581 Nonalcoholic steatohepatitis (NASH): Secondary | ICD-10-CM | POA: Insufficient documentation

## 2020-10-01 DIAGNOSIS — K2289 Other specified disease of esophagus: Secondary | ICD-10-CM

## 2020-10-01 DIAGNOSIS — Z79899 Other long term (current) drug therapy: Secondary | ICD-10-CM | POA: Insufficient documentation

## 2020-10-01 DIAGNOSIS — I8500 Esophageal varices without bleeding: Secondary | ICD-10-CM

## 2020-10-01 DIAGNOSIS — K766 Portal hypertension: Secondary | ICD-10-CM

## 2020-10-01 DIAGNOSIS — Z794 Long term (current) use of insulin: Secondary | ICD-10-CM | POA: Insufficient documentation

## 2020-10-01 DIAGNOSIS — K3189 Other diseases of stomach and duodenum: Secondary | ICD-10-CM | POA: Insufficient documentation

## 2020-10-01 DIAGNOSIS — Z7984 Long term (current) use of oral hypoglycemic drugs: Secondary | ICD-10-CM | POA: Insufficient documentation

## 2020-10-01 DIAGNOSIS — K317 Polyp of stomach and duodenum: Secondary | ICD-10-CM | POA: Insufficient documentation

## 2020-10-01 DIAGNOSIS — K7460 Unspecified cirrhosis of liver: Secondary | ICD-10-CM

## 2020-10-01 DIAGNOSIS — I8510 Secondary esophageal varices without bleeding: Secondary | ICD-10-CM | POA: Insufficient documentation

## 2020-10-01 HISTORY — PX: ESOPHAGOGASTRODUODENOSCOPY (EGD) WITH PROPOFOL: SHX5813

## 2020-10-01 LAB — GLUCOSE, CAPILLARY
Glucose-Capillary: 208 mg/dL — ABNORMAL HIGH (ref 70–99)
Glucose-Capillary: 181 mg/dL — ABNORMAL HIGH (ref 70–99)

## 2020-10-01 SURGERY — ESOPHAGOGASTRODUODENOSCOPY (EGD) WITH PROPOFOL
Anesthesia: General

## 2020-10-01 MED ORDER — LIDOCAINE HCL (CARDIAC) PF 100 MG/5ML IV SOSY
PREFILLED_SYRINGE | INTRAVENOUS | Status: DC | PRN
  Administered 2020-10-01: 25 mg via INTRAVENOUS

## 2020-10-01 MED ORDER — LIDOCAINE HCL (PF) 2 % IJ SOLN
INTRAMUSCULAR | Status: AC
  Filled 2020-10-01: qty 5

## 2020-10-01 MED ORDER — PROPOFOL 1000 MG/100ML IV EMUL
INTRAVENOUS | Status: AC
  Filled 2020-10-01: qty 100

## 2020-10-01 MED ORDER — STERILE WATER FOR IRRIGATION IR SOLN
Status: DC | PRN
  Administered 2020-10-01: 100 mL

## 2020-10-01 MED ORDER — LACTATED RINGERS IV SOLN: INTRAVENOUS | Status: DC

## 2020-10-01 MED ORDER — PROPOFOL 10 MG/ML IV BOLUS
INTRAVENOUS | Status: DC | PRN
  Administered 2020-10-01: 20 mg via INTRAVENOUS
  Administered 2020-10-01: 60 mg via INTRAVENOUS
  Administered 2020-10-01: 80 mg via INTRAVENOUS
  Administered 2020-10-01: 40 mg via INTRAVENOUS
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
## 2020-10-01 NOTE — Op Note (Signed)
[facility] Patient Name: Shawn Ward Procedure Date: 10/01/2020 7:05 AM MRN: [id number] Date of Birth: 06-09-1960 Attending MD: Hildred Laser , MD CSN: [id number] Age: 60 Admit Type: Outpatient Procedure:                Upper GI endoscopy Indications:              Esophageal varices, Follow-up of esophageal                            varices, For therapy of esophageal varices Providers:                Hildred Laser, MD, Lambert Mody, Aram Candela Referring MD:             Ozzie Hoyle PA, [facility] center. Medicines:                Propofol per Anesthesia Complications:            No immediate complications. Estimated Blood Loss:     Estimated blood loss: none. Procedure:                Pre-Anesthesia Assessment:                           - Prior to the procedure, a History and Physical                            was performed, and patient medications and                            allergies were reviewed. The patient's tolerance of                            previous anesthesia was also reviewed. The risks                            and benefits of the procedure and the sedation                            options and risks were discussed with the patient.                            All questions were answered, and informed consent                            was obtained. Prior Anticoagulants: The patient has                            taken no previous anticoagulant or antiplatelet                            agents. ASA Grade Assessment: III - A patient with                            severe systemic disease. After reviewing the risks  and benefits, the patient was deemed in                            satisfactory condition to undergo the procedure.                           After obtaining informed consent, the endoscope was                            passed under direct vision. Throughout the                            procedure, the  patient's blood pressure, pulse, and                            oxygen saturations were monitored continuously. The                            GIF-H190 ([id number]) scope was introduced through the                            mouth, and advanced to the second part of duodenum.                            The upper GI endoscopy was accomplished without                            difficulty. The patient tolerated the procedure                            well. Scope In: 7:42:42 AM Scope Out: 7:52:30 AM Total Procedure Duration: 0 hours 9 minutes 48 seconds  Findings:      The hypopharynx was normal.      Grade I varices were found in the middle third of the esophagus.      A post variceal banding scar was found in the distal esophagus.      The Z-line was regular and was found 39 cm from the incisors.      Moderate portal hypertensive gastropathy was found in the entire       examined stomach.      Multiple 3 to 5 mm sessile polyps with no stigmata of recent bleeding       were found in the gastric fundus and in the gastric body.      The duodenal bulb and second portion of the duodenum were normal. Impression:               - Normal hypopharynx.                           - Grade I esophageal varices. Varices were too                            small to be banded.                           -  Scar in the distal esophagus.                           - Z-line regular, 39 cm from the incisors.                           - Portal hypertensive gastropathy.                           - Multiple gastric polyps.                           - Normal duodenal bulb and second portion of the                            duodenum.                           - No specimens collected. Moderate Sedation:      Per Anesthesia Care Recommendation:           - Patient has a contact number available for                            emergencies. The signs and symptoms of potential                            delayed  complications were discussed with the                            patient. Return to normal activities tomorrow.                            Written discharge instructions were provided to the                            patient.                           - Resume previous diet today.                           - Continue present medications.                           - Repeat upper endoscopy in 6 months. Procedure Code(s):        --- Professional ---                           [phone], Esophagogastroduodenoscopy, flexible,                            transoral; diagnostic, including collection of                            specimen(s) by brushing or washing, when performed                            (  separate procedure) Diagnosis Code(s):        --- Professional ---                           I85.00, Esophageal varices without bleeding                           K22.8, Other specified diseases of esophagus                           K76.6, Portal hypertension                           K31.89, Other diseases of stomach and duodenum                           K31.7, Polyp of stomach and duodenum CPT copyright 2019 American [employer] Association. All rights reserved. The codes documented in this report are preliminary and upon coder review may  be revised to meet current compliance requirements. Hildred Laser, MD Hildred Laser, MD 10/01/2020 8:07:19 AM This report has been signed electronically. Number of Addenda: 0

## 2020-10-01 NOTE — Anesthesia Postprocedure Evaluation (Signed)
Anesthesia Post Note  Patient: RYE DECOSTE  Procedure(s) Performed: ESOPHAGOGASTRODUODENOSCOPY (EGD) WITH PROPOFOL  Patient location during evaluation: Phase II Anesthesia Type: General Level of consciousness: awake Pain management: pain level controlled Vital Signs Assessment: post-procedure vital signs reviewed and stable Respiratory status: spontaneous breathing and respiratory function stable Cardiovascular status: blood pressure returned to baseline and stable Postop Assessment: no headache and no apparent nausea or vomiting Anesthetic complications: no Comments: Late entry   No notable events documented.   Last Vitals:  Vitals:   10/01/20 0644 10/01/20 0757  BP: 119/75 (!) 98/50  Pulse: (!) 55 (!) 58  Resp: 10 16  Temp: 36.8 C 36.6 C  SpO2: 96% 98%    Last Pain:  Vitals:   10/01/20 0757  TempSrc: Oral  PainSc: 0-No pain                 Louann Sjogren

## 2020-10-01 NOTE — Discharge Instructions (Addendum)
Resume usual medications and diet as before. No driving for 24 hours. Next esophagogastroduodenoscopy in 6 months.

## 2020-10-01 NOTE — Transfer of Care (Signed)
Immediate Anesthesia Transfer of Care Note  Patient: Shawn Ward  Procedure(s) Performed: ESOPHAGOGASTRODUODENOSCOPY (EGD) WITH PROPOFOL  Patient Location: Short Stay  Anesthesia Type:General  Level of Consciousness: awake, alert  and oriented  Airway & Oxygen Therapy: Patient Spontanous Breathing  Post-op Assessment: Report given to RN and Post -op Vital signs reviewed and stable  Post vital signs: Reviewed and stable  Last Vitals:  Vitals Value Taken Time  BP    Temp    Pulse 58 10/01/20  0758  Resp    SpO2 97   10/01/20  0758    Last Pain:  Vitals:   10/01/20 0737  TempSrc:   PainSc: 0-No pain      Patients Stated Pain Goal: 8 (50/15/86 8257)  Complications: No notable events documented.

## 2020-10-01 NOTE — Anesthesia Preprocedure Evaluation (Signed)
Anesthesia Evaluation  Patient identified by MRN, date of birth, ID band Patient awake    Reviewed: Allergy & Precautions, H&P , NPO status , Patient's Chart, lab work & pertinent test results, reviewed documented beta blocker date and time   Airway Mallampati: II  TM Distance: >3 FB Neck ROM: full    Dental no notable dental hx.    Pulmonary sleep apnea ,    Pulmonary exam normal breath sounds clear to auscultation       Cardiovascular Exercise Tolerance: Good hypertension,  Rhythm:regular Rate:Normal     Neuro/Psych negative neurological ROS  negative psych ROS   GI/Hepatic GERD  Medicated,(+) Hepatitis -, Autoimmune  Endo/Other  negative endocrine ROSdiabetes  Renal/GU negative Renal ROS  negative genitourinary   Musculoskeletal   Abdominal   Peds  Hematology negative hematology ROS (+)   Anesthesia Other Findings   Reproductive/Obstetrics negative OB ROS                             Anesthesia Physical  Anesthesia Plan  ASA: 3  Anesthesia Plan: General   Post-op Pain Management:    Induction:   PONV Risk Score and Plan: Propofol infusion  Airway Management Planned:   Additional Equipment:   Intra-op Plan:   Post-operative Plan:   Informed Consent: I have reviewed the patients History and Physical, chart, labs and discussed the procedure including the risks, benefits and alternatives for the proposed anesthesia with the patient or authorized representative who has indicated his/her understanding and acceptance.     Dental Advisory Given  Plan Discussed with: CRNA  Anesthesia Plan Comments:         Anesthesia Quick Evaluation

## 2020-10-07 ENCOUNTER — Encounter (HOSPITAL_COMMUNITY): Payer: Self-pay | Admitting: Internal Medicine

## 2020-12-17 ENCOUNTER — Encounter (INDEPENDENT_AMBULATORY_CARE_PROVIDER_SITE_OTHER): Payer: Self-pay

## 2021-01-12 ENCOUNTER — Other Ambulatory Visit (INDEPENDENT_AMBULATORY_CARE_PROVIDER_SITE_OTHER): Payer: Self-pay

## 2021-01-12 DIAGNOSIS — I8500 Esophageal varices without bleeding: Secondary | ICD-10-CM

## 2021-02-16 ENCOUNTER — Encounter (INDEPENDENT_AMBULATORY_CARE_PROVIDER_SITE_OTHER): Payer: Self-pay | Admitting: *Deleted

## 2021-03-09 ENCOUNTER — Encounter (INDEPENDENT_AMBULATORY_CARE_PROVIDER_SITE_OTHER): Payer: Self-pay

## 2021-03-11 ENCOUNTER — Telehealth (INDEPENDENT_AMBULATORY_CARE_PROVIDER_SITE_OTHER): Payer: Self-pay | Admitting: *Deleted

## 2021-03-11 ENCOUNTER — Other Ambulatory Visit (INDEPENDENT_AMBULATORY_CARE_PROVIDER_SITE_OTHER): Payer: Self-pay

## 2021-03-11 DIAGNOSIS — K7469 Other cirrhosis of liver: Secondary | ICD-10-CM

## 2021-03-11 NOTE — Telephone Encounter (Signed)
Pt's wife called and asked if labs for meld score could be added to preop bw he will be doing on 12/23 for upcoming EGD. States [facility] liver transplant team is requesting this.   Per Vikki Ports - ok to add cmp and INR. Discussed with Serena Colonel and test were added and I called pt's wife and notified her.   [phone]

## 2021-03-12 NOTE — Patient Instructions (Signed)
TY BUNTROCK  03/12/2021     @PREFPERIOPPHARMACY @   Your procedure is scheduled on  03/18/2021.   Report to [facility] at  0730  A.M.   Call this number if you have problems the morning of surgery:  [phone]   Remember:  Follow the diet and prep instructions given to you by the office.    DO NOT take any medications for diabetes the morning of your procedure.    Take these medicines the morning of surgery with A SIP OF WATER                                     Nadolol, protonix.    Do not wear jewelry, make-up or nail polish.  Do not wear lotions, powders, or perfumes, or deodorant.  Do not shave 48 hours prior to surgery.  Men may shave face and neck.  Do not bring valuables to the hospital.  [facility] is not responsible for any belongings or valuables.  Contacts, dentures or bridgework may not be worn into surgery.  Leave your suitcase in the car.  After surgery it may be brought to your room.  For patients admitted to the hospital, discharge time will be determined by your treatment team.  Patients discharged the day of surgery will not be allowed to drive home and must have someone with them for 24 hours.    Special instructions:   DO NOT smoke tobacco or vape for 24 hours before your procedure.  Please read over the following fact sheets that you were given. Anesthesia Post-op Instructions and Care and Recovery After Surgery      Upper Endoscopy, Adult, Care After This sheet gives you information about how to care for yourself after your procedure. Your health care provider may also give you more specific instructions. If you have problems or questions, contact your health care provider. What can I expect after the procedure? After the procedure, it is common to have: A sore throat. Mild stomach pain or discomfort. Bloating. Nausea. Follow these instructions at home:  Follow instructions from your health care provider about what to eat or  drink after your procedure. Return to your normal activities as told by your health care provider. Ask your health care provider what activities are safe for you. Take over-the-counter and prescription medicines only as told by your health care provider. If you were given a sedative during the procedure, it can affect you for several hours. Do not drive or operate machinery until your health care provider says that it is safe. Keep all follow-up visits as told by your health care provider. This is important. Contact a health care provider if you have: A sore throat that lasts longer than one day. Trouble swallowing. Get help right away if: You vomit blood or your vomit looks like coffee grounds. You have: A fever. Bloody, black, or tarry stools. A severe sore throat or you cannot swallow. Difficulty breathing. Severe pain in your chest or abdomen. Summary After the procedure, it is common to have a sore throat, mild stomach discomfort, bloating, and nausea. If you were given a sedative during the procedure, it can affect you for several hours. Do not drive or operate machinery until your health care provider says that it is safe. Follow instructions from your health care provider about what to eat or drink after your procedure.  Return to your normal activities as told by your health care provider. This information is not intended to replace advice given to you by your health care provider. Make sure you discuss any questions you have with your health care provider. Document Revised: 01/12/2019 Document Reviewed: 08/08/2017 Elsevier Patient Education  2022 [employer] After This sheet gives you information about how to care for yourself after your procedure. Your health care provider may also give you more specific instructions. If you have problems or questions, contact your health care provider. What can I expect after the procedure? After the procedure,  it is common to have: Tiredness. Forgetfulness about what happened after the procedure. Impaired judgment for important decisions. Nausea or vomiting. Some difficulty with balance. Follow these instructions at home: For the time period you were told by your health care provider:   Rest as needed. Do not participate in activities where you could fall or become injured. Do not drive or use machinery. Do not drink alcohol. Do not take sleeping pills or medicines that cause drowsiness. Do not make important decisions or sign legal documents. Do not take care of children on your own. Eating and drinking Follow the diet that is recommended by your health care provider. Drink enough fluid to keep your urine pale yellow. If you vomit: Drink water, juice, or soup when you can drink without vomiting. Make sure you have little or no nausea before eating solid foods. General instructions Have a responsible adult stay with you for the time you are told. It is important to have someone help care for you until you are awake and alert. Take over-the-counter and prescription medicines only as told by your health care provider. If you have sleep apnea, surgery and certain medicines can increase your risk for breathing problems. Follow instructions from your health care provider about wearing your sleep device: Anytime you are sleeping, including during daytime naps. While taking prescription pain medicines, sleeping medicines, or medicines that make you drowsy. Avoid smoking. Keep all follow-up visits as told by your health care provider. This is important. Contact a health care provider if: You keep feeling nauseous or you keep vomiting. You feel light-headed. You are still sleepy or having trouble with balance after 24 hours. You develop a rash. You have a fever. You have redness or swelling around the IV site. Get help right away if: You have trouble breathing. You have new-onset confusion at  home. Summary For several hours after your procedure, you may feel tired. You may also be forgetful and have poor judgment. Have a responsible adult stay with you for the time you are told. It is important to have someone help care for you until you are awake and alert. Rest as told. Do not drive or operate machinery. Do not drink alcohol or take sleeping pills. Get help right away if you have trouble breathing, or if you suddenly become confused. This information is not intended to replace advice given to you by your health care provider. Make sure you discuss any questions you have with your health care provider. Document Revised: 11/22/2019 Document Reviewed: 02/08/2019 Elsevier Patient Education  2022 [employer].

## 2021-03-13 ENCOUNTER — Encounter (HOSPITAL_COMMUNITY): Payer: BC Managed Care – PPO

## 2021-03-13 ENCOUNTER — Encounter (HOSPITAL_COMMUNITY)
Admission: RE | Admit: 2021-03-13 | Discharge: 2021-03-13 | Disposition: A | Discharge status: Home / Self Care | Payer: BC Managed Care – PPO | Source: Ambulatory Visit | Attending: Internal Medicine | Admitting: Internal Medicine

## 2021-03-13 ENCOUNTER — Other Ambulatory Visit: Payer: Self-pay

## 2021-03-13 VITALS — BP 122/60 | HR 59 | Temp 97.5°F | Resp 18 | Ht 71.0 in | Wt 231.0 lb

## 2021-03-13 DIAGNOSIS — K7460 Unspecified cirrhosis of liver: Secondary | ICD-10-CM

## 2021-03-13 DIAGNOSIS — I8500 Esophageal varices without bleeding: Secondary | ICD-10-CM

## 2021-03-13 DIAGNOSIS — Z01812 Encounter for preprocedural laboratory examination: Secondary | ICD-10-CM | POA: Insufficient documentation

## 2021-03-13 DIAGNOSIS — D696 Thrombocytopenia, unspecified: Secondary | ICD-10-CM

## 2021-03-13 LAB — CBC WITH DIFFERENTIAL/PLATELET
Abs Immature Granulocytes: 0.01 10*3/uL (ref 0.00–0.07)
Basophils Absolute: 0 10*3/uL (ref 0.0–0.1)
Basophils Relative: 1 %
Eosinophils Absolute: 0.2 10*3/uL (ref 0.0–0.5)
Eosinophils Relative: 5 %
HCT: 40.2 % (ref 39.0–52.0)
Hemoglobin: 14.2 g/dL (ref 13.0–17.0)
Immature Granulocytes: 0 %
Lymphocytes Relative: 23 %
Lymphs Abs: 0.8 10*3/uL (ref 0.7–4.0)
MCH: 36.8 pg — ABNORMAL HIGH (ref 26.0–34.0)
MCHC: 35.3 g/dL (ref 30.0–36.0)
MCV: 104.1 fL — ABNORMAL HIGH (ref 80.0–100.0)
Monocytes Absolute: 0.5 10*3/uL (ref 0.1–1.0)
Monocytes Relative: 13 %
Neutro Abs: 1.9 10*3/uL (ref 1.7–7.7)
Neutrophils Relative %: 58 %
Platelets: 48 10*3/uL — ABNORMAL LOW (ref 150–400)
RBC: 3.86 MIL/uL — ABNORMAL LOW (ref 4.22–5.81)
RDW: 12.3 % (ref 11.5–15.5)
WBC: 3.4 10*3/uL — ABNORMAL LOW (ref 4.0–10.5)
nRBC: 0 % (ref 0.0–0.2)

## 2021-03-13 LAB — COMPREHENSIVE METABOLIC PANEL
ALT: 27 U/L (ref 0–44)
AST: 37 U/L (ref 15–41)
Albumin: 3.1 g/dL — ABNORMAL LOW (ref 3.5–5.0)
Alkaline Phosphatase: 62 U/L (ref 38–126)
Anion gap: 9 (ref 5–15)
BUN: 15 mg/dL (ref 8–23)
CO2: 23 mmol/L (ref 22–32)
Calcium: 8.7 mg/dL — ABNORMAL LOW (ref 8.9–10.3)
Chloride: 102 mmol/L (ref 98–111)
Creatinine, Ser: 0.92 mg/dL (ref 0.61–1.24)
GFR, Estimated: >60 mL/min (ref >60)
Glucose, Bld: 148 mg/dL — ABNORMAL HIGH (ref 70–99)
Potassium: 3.9 mmol/L (ref 3.5–5.1)
Sodium: 134 mmol/L — ABNORMAL LOW (ref 135–145)
Total Bilirubin: 4.2 mg/dL — ABNORMAL HIGH (ref 0.3–1.2)
Total Protein: 6.2 g/dL — ABNORMAL LOW (ref 6.5–8.1)

## 2021-03-13 LAB — PROTIME-INR
INR: 1.3 — ABNORMAL HIGH (ref 0.8–1.2)
Prothrombin Time: 16.5 seconds — ABNORMAL HIGH (ref 11.4–15.2)

## 2021-03-18 ENCOUNTER — Ambulatory Visit (HOSPITAL_COMMUNITY)
Admission: RE | Admit: 2021-03-18 | Discharge: 2021-03-18 | Disposition: A | Discharge status: Home / Self Care | Payer: BC Managed Care – PPO | Attending: Internal Medicine | Admitting: Internal Medicine

## 2021-03-18 ENCOUNTER — Ambulatory Visit (HOSPITAL_COMMUNITY): Payer: BC Managed Care – PPO | Admitting: Anesthesiology

## 2021-03-18 ENCOUNTER — Encounter (HOSPITAL_COMMUNITY): Payer: Self-pay | Admitting: Internal Medicine

## 2021-03-18 ENCOUNTER — Other Ambulatory Visit: Payer: Self-pay

## 2021-03-18 ENCOUNTER — Encounter (INDEPENDENT_AMBULATORY_CARE_PROVIDER_SITE_OTHER): Payer: Self-pay | Admitting: *Deleted

## 2021-03-18 ENCOUNTER — Encounter (HOSPITAL_COMMUNITY): Payer: Self-pay

## 2021-03-18 ENCOUNTER — Ambulatory Visit (HOSPITAL_COMMUNITY): Admit: 2021-03-18 | Payer: BC Managed Care – PPO | Admitting: Internal Medicine

## 2021-03-18 ENCOUNTER — Encounter (HOSPITAL_COMMUNITY)
Admission: RE | Disposition: A | Discharge status: Home / Self Care | Payer: Self-pay | Source: Home / Self Care | Attending: Internal Medicine

## 2021-03-18 DIAGNOSIS — E119 Type 2 diabetes mellitus without complications: Secondary | ICD-10-CM | POA: Insufficient documentation

## 2021-03-18 DIAGNOSIS — G4730 Sleep apnea, unspecified: Secondary | ICD-10-CM | POA: Insufficient documentation

## 2021-03-18 DIAGNOSIS — K766 Portal hypertension: Secondary | ICD-10-CM | POA: Insufficient documentation

## 2021-03-18 DIAGNOSIS — I8510 Secondary esophageal varices without bleeding: Secondary | ICD-10-CM | POA: Insufficient documentation

## 2021-03-18 DIAGNOSIS — I10 Essential (primary) hypertension: Secondary | ICD-10-CM | POA: Diagnosis not present

## 2021-03-18 DIAGNOSIS — K317 Polyp of stomach and duodenum: Secondary | ICD-10-CM | POA: Diagnosis not present

## 2021-03-18 DIAGNOSIS — K3189 Other diseases of stomach and duodenum: Secondary | ICD-10-CM | POA: Diagnosis not present

## 2021-03-18 DIAGNOSIS — K7460 Unspecified cirrhosis of liver: Secondary | ICD-10-CM | POA: Insufficient documentation

## 2021-03-18 DIAGNOSIS — I8500 Esophageal varices without bleeding: Secondary | ICD-10-CM

## 2021-03-18 DIAGNOSIS — K219 Gastro-esophageal reflux disease without esophagitis: Secondary | ICD-10-CM | POA: Diagnosis not present

## 2021-03-18 DIAGNOSIS — K7581 Nonalcoholic steatohepatitis (NASH): Secondary | ICD-10-CM | POA: Diagnosis not present

## 2021-03-18 DIAGNOSIS — Z09 Encounter for follow-up examination after completed treatment for conditions other than malignant neoplasm: Secondary | ICD-10-CM | POA: Insufficient documentation

## 2021-03-18 DIAGNOSIS — K2289 Other specified disease of esophagus: Secondary | ICD-10-CM | POA: Diagnosis not present

## 2021-03-18 DIAGNOSIS — K7469 Other cirrhosis of liver: Secondary | ICD-10-CM

## 2021-03-18 HISTORY — PX: ESOPHAGEAL BANDING: SHX5518

## 2021-03-18 HISTORY — PX: ESOPHAGOGASTRODUODENOSCOPY (EGD) WITH PROPOFOL: SHX5813

## 2021-03-18 LAB — GLUCOSE, CAPILLARY
Glucose-Capillary: 209 mg/dL — ABNORMAL HIGH (ref 70–99)
Glucose-Capillary: 176 mg/dL — ABNORMAL HIGH (ref 70–99)

## 2021-03-18 SURGERY — ESOPHAGOGASTRODUODENOSCOPY (EGD) WITH PROPOFOL
Anesthesia: Monitor Anesthesia Care

## 2021-03-18 SURGERY — ESOPHAGOGASTRODUODENOSCOPY (EGD) WITH PROPOFOL
Anesthesia: General

## 2021-03-18 MED ORDER — PROPOFOL 500 MG/50ML IV EMUL
INTRAVENOUS | Status: DC | PRN
  Administered 2021-03-18: 150 ug/kg/min via INTRAVENOUS

## 2021-03-18 MED ORDER — LIDOCAINE HCL (PF) 2 % IJ SOLN
INTRAMUSCULAR | Status: AC
  Filled 2021-03-18: qty 5

## 2021-03-18 MED ORDER — PROPOFOL 1000 MG/100ML IV EMUL
INTRAVENOUS | Status: AC
  Filled 2021-03-18: qty 100

## 2021-03-18 MED ORDER — PHENYLEPHRINE HCL (PRESSORS) 10 MG/ML IV SOLN
INTRAVENOUS | Status: DC | PRN
  Administered 2021-03-18 (×2): 100 ug via INTRAVENOUS

## 2021-03-18 MED ORDER — SODIUM CHLORIDE 0.9 % IV SOLN: INTRAVENOUS | Status: DC

## 2021-03-18 MED ORDER — LIDOCAINE HCL (CARDIAC) PF 100 MG/5ML IV SOSY
PREFILLED_SYRINGE | INTRAVENOUS | Status: DC | PRN
  Administered 2021-03-18: 50 mg via INTRAVENOUS

## 2021-03-18 MED ORDER — LACTATED RINGERS IV SOLN: INTRAVENOUS | Status: DC

## 2021-03-18 MED ORDER — PHENYLEPHRINE 40 MCG/ML (10ML) SYRINGE FOR IV PUSH (FOR BLOOD PRESSURE SUPPORT)
PREFILLED_SYRINGE | INTRAVENOUS | Status: AC
  Filled 2021-03-18: qty 20

## 2021-03-18 MED ORDER — PROPOFOL 10 MG/ML IV BOLUS
INTRAVENOUS | Status: DC | PRN
  Administered 2021-03-18: 50 mg via INTRAVENOUS

## 2021-03-18 NOTE — Anesthesia Preprocedure Evaluation (Signed)
Anesthesia Evaluation  Patient identified by MRN, date of birth, ID band Patient awake    Reviewed: Allergy & Precautions, H&P , NPO status , Patient's Chart, lab work & pertinent test results, reviewed documented beta blocker date and time   Airway Mallampati: II  TM Distance: >3 FB Neck ROM: full    Dental no notable dental hx.    Pulmonary sleep apnea ,    Pulmonary exam normal breath sounds clear to auscultation       Cardiovascular Exercise Tolerance: Good hypertension,  Rhythm:regular Rate:Normal     Neuro/Psych negative neurological ROS  negative psych ROS   GI/Hepatic GERD  Medicated,(+) Hepatitis -, Autoimmune  Endo/Other  negative endocrine ROSdiabetes, Type 2  Renal/GU negative Renal ROS  negative genitourinary   Musculoskeletal   Abdominal   Peds  Hematology negative hematology ROS (+)   Anesthesia Other Findings   Reproductive/Obstetrics negative OB ROS                             Anesthesia Physical  Anesthesia Plan  ASA: 3  Anesthesia Plan: General   Post-op Pain Management:    Induction:   PONV Risk Score and Plan: Propofol infusion  Airway Management Planned:   Additional Equipment:   Intra-op Plan:   Post-operative Plan:   Informed Consent: I have reviewed the patients History and Physical, chart, labs and discussed the procedure including the risks, benefits and alternatives for the proposed anesthesia with the patient or authorized representative who has indicated his/her understanding and acceptance.     Dental Advisory Given  Plan Discussed with: CRNA  Anesthesia Plan Comments:         Anesthesia Quick Evaluation

## 2021-03-18 NOTE — Op Note (Signed)
[facility] Patient Name: Shawn Ward Procedure Date: 03/18/2021 8:50 AM MRN: [id number] Date of Birth: Aug 09, 1960 Attending MD: Hildred Laser , MD CSN: [id number] Age: 60 Admit Type: Outpatient Procedure:                Upper GI endoscopy Indications:              Esophageal varices, Follow-up of esophageal                            varices, For therapy of esophageal varices Providers:                Hildred Laser, MD, Lambert Mody, Kristine L.                            Risa Grill, Technician Referring MD:              Medicines:                Propofol per Anesthesia Complications:            No immediate complications. Estimated Blood Loss:     Estimated blood loss: none. Procedure:                Pre-Anesthesia Assessment:                           - Prior to the procedure, a History and Physical                            was performed, and patient medications and                            allergies were reviewed. The patient's tolerance of                            previous anesthesia was also reviewed. The risks                            and benefits of the procedure and the sedation                            options and risks were discussed with the patient.                            All questions were answered, and informed consent                            was obtained. Prior Anticoagulants: The patient has                            taken no previous anticoagulant or antiplatelet                            agents. ASA Grade Assessment: III - A patient with  severe systemic disease. After reviewing the risks                            and benefits, the patient was deemed in                            satisfactory condition to undergo the procedure.                           After obtaining informed consent, the endoscope was                            passed under direct vision. Throughout the                            procedure,  the patient's blood pressure, pulse, and                            oxygen saturations were monitored continuously. The                            GIF-H190 ([id number]) scope was introduced through the                            mouth, and advanced to the second part of duodenum.                            The upper GI endoscopy was accomplished without                            difficulty. The patient tolerated the procedure                            well. Scope In: 9:17:02 AM Scope Out: 9:28:47 AM Total Procedure Duration: 0 hours 11 minutes 45 seconds  Findings:      The hypopharynx was normal.      The proximal esophagus was normal.      A post variceal banding scar was found in the mid esophagus and in the       distal esophagus.      Grade II varices were found in the distal esophagus. One band was       successfully placed with complete eradication, resulting in deflation of       varices. There was no bleeding during and at the end of the procedure.      The Z-line was regular and was found 39 cm from the incisors.      Moderate portal hypertensive gastropathy was found in the entire       examined stomach.      Multiple small sessile polyps with no stigmata of recent bleeding were       found in the gastric fundus and in the gastric body.      The duodenal bulb was normal.      Diffuse mildly congested mucosa without active bleeding and with no       stigmata of bleeding was found in the second portion  of the duodenum. Impression:               - Normal hypopharynx.                           - Normal proximal esophagus.                           - Scarring in the mid esophagus and in the distal                            esophagus secondary to previous esophageal variceal                            banding.                           - Grade II esophageal varices. Completely                            eradicated. Banded.                           - Z-line regular, 39 cm from  the incisors.                           - Portal hypertensive gastropathy.                           - Multiple gastric polyps.                           - Normal duodenal bulb.                           - Congested duodenal mucosa.                           - No specimens collected. Moderate Sedation:      Per Anesthesia Care Recommendation:           - Patient has a contact number available for                            emergencies. The signs and symptoms of potential                            delayed complications were discussed with the                            patient. Return to normal activities tomorrow.                            Written discharge instructions were provided to the                            patient.                           -  Full liquid diet today. Usual diet starting                            tomorrow morning                           - Continue present medications.                           - Repeat upper endoscopy in 6 months.                           Copy to Dr. Charlton Amor, hepatologist at [facility] Procedure Code(s):        --- Professional ---                           [phone], Esophagogastroduodenoscopy, flexible,                            transoral; with band ligation of esophageal/gastric                            varices Diagnosis Code(s):        --- Professional ---                           K22.8, Other specified diseases of esophagus                           I85.00, Esophageal varices without bleeding                           K76.6, Portal hypertension                           K31.89, Other diseases of stomach and duodenum                           K31.7, Polyp of stomach and duodenum CPT copyright 2019 American Medical Association. All rights reserved. The codes documented in this report are preliminary and upon coder review may  be revised to meet current compliance requirements. Hildred Laser, MD Hildred Laser, MD 03/18/2021 9:39:42  AM This report has been signed electronically. Number of Addenda: 0

## 2021-03-18 NOTE — Transfer of Care (Signed)
Immediate Anesthesia Transfer of Care Note  Patient: Shawn Ward  Procedure(s) Performed: ESOPHAGOGASTRODUODENOSCOPY (EGD) WITH PROPOFOL ESOPHAGEAL BANDING  Patient Location: PACU  Anesthesia Type:General  Level of Consciousness: awake, alert , oriented and patient cooperative  Airway & Oxygen Therapy: Patient Spontanous Breathing  Post-op Assessment: Report given to RN, Post -op Vital signs reviewed and stable and Patient moving all extremities X 4  Post vital signs: Reviewed and stable  Last Vitals:  Vitals Value Taken Time  BP    Temp    Pulse    Resp    SpO2      Last Pain:  Vitals:   03/18/21 0911  TempSrc:   PainSc: 0-No pain         Complications: No notable events documented.

## 2021-03-18 NOTE — Anesthesia Postprocedure Evaluation (Signed)
Anesthesia Post Note  Patient: Shawn Ward  Procedure(s) Performed: ESOPHAGOGASTRODUODENOSCOPY (EGD) WITH PROPOFOL ESOPHAGEAL BANDING  Patient location during evaluation: Phase II Anesthesia Type: General Level of consciousness: awake Pain management: pain level controlled Vital Signs Assessment: post-procedure vital signs reviewed and stable Respiratory status: spontaneous breathing and respiratory function stable Cardiovascular status: blood pressure returned to baseline and stable Postop Assessment: no headache and no apparent nausea or vomiting Anesthetic complications: no Comments: Late entry   No notable events documented.   Last Vitals:  Vitals:   03/18/21 0935 03/18/21 0939  BP:  113/67  Pulse: 73   Resp: 16   Temp: 36.9 C   SpO2: 95%     Last Pain:  Vitals:   03/18/21 0935  TempSrc: Oral  PainSc: 0-No pain                 Louann Sjogren
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
## 2021-03-18 NOTE — Discharge Instructions (Signed)
Resume usual medications as before. Full liquids today.  Resume usual diet starting tomorrow morning. No driving for 24 hours. Repeat exam in 6 months.

## 2021-03-24 ENCOUNTER — Encounter (HOSPITAL_COMMUNITY): Payer: Self-pay | Admitting: Internal Medicine

## 2021-06-10 ENCOUNTER — Other Ambulatory Visit (INDEPENDENT_AMBULATORY_CARE_PROVIDER_SITE_OTHER): Payer: Self-pay

## 2021-06-10 DIAGNOSIS — I8500 Esophageal varices without bleeding: Secondary | ICD-10-CM

## 2021-06-12 ENCOUNTER — Encounter (INDEPENDENT_AMBULATORY_CARE_PROVIDER_SITE_OTHER): Payer: Self-pay

## 2021-08-14 ENCOUNTER — Other Ambulatory Visit (HOSPITAL_COMMUNITY): Payer: BC Managed Care – PPO

## 2021-08-14 NOTE — Patient Instructions (Signed)
EXODUS KUTZER  08/14/2021     @PREFPERIOPPHARMACY @   Your procedure is scheduled on 08/19/2021.  Report to [facility] at 6:00 A.M.  Call this number if you have problems the morning of surgery:  [phone]   Remember:  Please follow the diet and prep instructions given to you by Dr Olevia Perches office.     Take these medicines the morning of surgery with A SIP OF WATER :  Nadolol, Protonix, Xifaxan    Do not wear jewelry, make-up or nail polish.  Do not wear lotions, powders, or perfumes, or deodorant.  Do not shave 48 hours prior to surgery.  Men may shave face and neck.  Do not bring valuables to the hospital.  [facility] is not responsible for any belongings or valuables.  Contacts, dentures or bridgework may not be worn into surgery.  Leave your suitcase in the car.  After surgery it may be brought to your room.  For patients admitted to the hospital, discharge time will be determined by your treatment team.  Patients discharged the day of surgery will not be allowed to drive home.   Name and phone number of your driver:   Family Special instructions:  N/a  Please read over the following fact sheets that you were given. Care and Recovery After Surgery   Upper Endoscopy, Adult Upper endoscopy is a procedure to look inside the upper GI (gastrointestinal) tract. The upper GI tract is made up of: The esophagus. This is the part of the body that moves food from your mouth to your stomach. The stomach. The duodenum. This is the first part of your small intestine. This procedure is also called esophagogastroduodenoscopy (EGD) or gastroscopy. In this procedure, your health care provider passes a thin, flexible tube (endoscope) through your mouth and down your esophagus into your stomach and into your duodenum. A small camera is attached to the end of the tube. Images from the camera appear on a monitor in the exam room. During this procedure, your health care provider may also  remove a small piece of tissue to be sent to a lab and examined under a microscope (biopsy). Your health care provider may do an upper endoscopy to diagnose cancers of the upper GI tract. You may also have this procedure to find the cause of other conditions, such as: Stomach pain. Heartburn. Pain or problems when swallowing. Nausea and vomiting. Stomach bleeding. Stomach ulcers. Tell a health care provider about: Any allergies you have. All medicines you are taking, including vitamins, herbs, eye drops, creams, and over-the-counter medicines. Any problems you or family members have had with anesthetic medicines. Any bleeding problems you have. Any surgeries you have had. Any medical conditions you have. Whether you are pregnant or may be pregnant. What are the risks? Generally, this is a safe procedure. However, problems may occur, including: Infection. Bleeding. Allergic reactions to medicines. A tear or hole (perforation) in the esophagus, stomach, or duodenum. What happens before the procedure? When to stop eating and drinking Follow instructions from your health care provider about what you may eat and drink before your procedure. These may include: 8 hours before your procedure Stop eating most foods. Do not eat meat, fried foods, or fatty foods. Eat only light foods, such as toast or crackers. All liquids are okay except energy drinks and alcohol. 6 hours before your procedure Stop eating. Drink only clear liquids, such as water, clear fruit juice, black coffee, plain tea, and sports drinks.  Do not drink energy drinks or alcohol. 2 hours before your procedure Stop drinking all liquids. You may be allowed to take medicines with small sips of water. If you do not follow your health care provider's instructions, your procedure may be delayed or canceled. Medicines Ask your health care provider about: Changing or stopping your regular medicines. This is especially important  if you are taking diabetes medicines or blood thinners. Taking medicines such as aspirin and ibuprofen. These medicines can thin your blood. Do not take these medicines unless your health care provider tells you to take them. Taking over-the-counter medicines, vitamins, herbs, and supplements. General instructions If you will be going home right after the procedure, plan to have a responsible adult: Take you home from the hospital or clinic. You will not be allowed to drive. Care for you for the time you are told. What happens during the procedure?  An IV will be inserted into one of your veins. You may be given one or more of the following: A medicine to help you relax (sedative). A medicine to numb the throat (local anesthetic). You will lie on your left side on an exam table. Your health care provider will pass the endoscope through your mouth and down your esophagus. Your health care provider will use the scope to check the inside of your esophagus, stomach, and duodenum. Biopsies may be taken. The endoscope will be removed. The procedure may vary among health care providers and hospitals. What can I expect after the procedure? After your procedure, it is common to have: A sore throat. Mild stomach pain or discomfort. Bloating. Nausea. When your throat is no longer numb, you may be given some fluids to drink. Your blood pressure, heart rate, breathing rate, and blood oxygen level will be monitored until you leave the hospital or clinic. It is up to you to get the results of your procedure. Ask your health care provider, or the department that is doing the procedure, when your results will be ready. Follow these instructions at home:  Follow instructions from your health care provider about eating or drinking restrictions. Take over-the-counter and prescription medicines only as told by your health care provider. Return to your normal activities as told by your health care provider.  Ask your health care provider what activities are safe for you. If you were given a sedative during the procedure, it can affect you for several hours. Do not drive or operate machinery until your health care provider says that it is safe. Keep all follow-up visits. This is important. Contact a health care provider if: You have a sore throat that lasts longer than 1 day. You have trouble swallowing. Get help right away if: You vomit blood or your vomit looks like coffee grounds. You have a fever. You have bloody, black, or tarry stools. You have a severe sore throat or you cannot swallow. You have difficulty breathing or severe pain in your chest. These symptoms may be an emergency. Get help right away. Call 911. Do not wait to see if the symptoms will go away. Do not drive yourself to the hospital. Summary Upper endoscopy is a procedure to look inside the upper GI tract. During the procedure, an IV will be inserted into one of your veins. You may be given a medicine to help you relax. The endoscope will be passed through your mouth and down your esophagus. After the procedure, follow instructions from your health care provider about eating or drinking restrictions. This  information is not intended to replace advice given to you by your health care provider. Make sure you discuss any questions you have with your health care provider. Document Revised: 12/02/2020 Document Reviewed: 12/02/2020 Elsevier Patient Education  [place] Anesthesia refers to techniques, procedures, and medicines that help a person stay safe and comfortable during a medical or dental procedure. Monitored anesthesia care, or sedation, is one type of anesthesia. Your anesthesia specialist may recommend sedation if you will be having a procedure that does not require you to be unconscious. You may have this procedure for: Cataract surgery. A dental procedure. A biopsy. A  colonoscopy. During the procedure, you may receive a medicine to help you relax (sedative). There are three levels of sedation: Mild sedation. At this level, you may feel awake and relaxed. You will be able to follow directions. Moderate sedation. At this level, you will be sleepy. You may not remember the procedure. Deep sedation. At this level, you will be asleep. You will not remember the procedure. The more medicine you are given, the deeper your level of sedation will be. Depending on how you respond to the procedure, the anesthesia specialist may change your level of sedation or the type of anesthesia to fit your needs. An anesthesia specialist will monitor you closely during the procedure. Tell a health care provider about: Any allergies you have. All medicines you are taking, including vitamins, herbs, eye drops, creams, and over-the-counter medicines. Any problems you or family members have had with anesthetic medicines. Any blood disorders you have. Any surgeries you have had. Any medical conditions you have, such as sleep apnea. Whether you are pregnant or may be pregnant. Whether you use cigarettes, alcohol, or drugs. Any use of steroids, whether by mouth or as a cream. What are the risks? Generally, this is a safe procedure. However, problems may occur, including: Getting too much medicine (oversedation). Nausea. Allergic reaction to medicines. Trouble breathing. If this happens, a breathing tube may be used to help with breathing. It will be removed when you are awake and breathing on your own. Heart trouble. Lung trouble. Confusion that gets better with time (emergence delirium). What happens before the procedure? Staying hydrated Follow instructions from your health care provider about hydration, which may include: Up to 2 hours before the procedure - you may continue to drink clear liquids, such as water, clear fruit juice, black coffee, and plain tea. Eating and  drinking restrictions Follow instructions from your health care provider about eating and drinking, which may include: 8 hours before the procedure - stop eating heavy meals or foods, such as meat, fried foods, or fatty foods. 6 hours before the procedure - stop eating light meals or foods, such as toast or cereal. 6 hours before the procedure - stop drinking milk or drinks that contain milk. 2 hours before the procedure - stop drinking clear liquids. Medicines Ask your health care provider about: Changing or stopping your regular medicines. This is especially important if you are taking diabetes medicines or blood thinners. Taking medicines such as aspirin and ibuprofen. These medicines can thin your blood. Do not take these medicines unless your health care provider tells you to take them. Taking over-the-counter medicines, vitamins, herbs, and supplements. Tests and exams You will have a physical exam. You may have blood tests done to show: How well your kidneys and liver are working. How well your blood can clot. General instructions Plan to have a responsible  adult take you home from the hospital or clinic. If you will be going home right after the procedure, plan to have a responsible adult care for you for the time you are told. This is important. What happens during the procedure?  Your blood pressure, heart rate, breathing, level of pain, and overall condition will be monitored. An IV will be inserted into one of your veins. You will be given medicines as needed to keep you comfortable during the procedure. This may mean changing the level of sedation. Depending on your age or the procedure, the sedative may be given: As a pill that you will swallow or as a pill that is inserted into the rectum. As an injection into the vein or muscle. As a spray through the nose. The procedure will be performed. Your breathing, heart rate, and blood pressure will be monitored during the  procedure. When the procedure is over, the medicine will be stopped. The procedure may vary among health care providers and hospitals. What happens after the procedure? Your blood pressure, heart rate, breathing rate, and blood oxygen level will be monitored until you leave the hospital or clinic. You may feel sleepy, clumsy, or nauseous. You may feel forgetful about what happened after the procedure. You may vomit. You may continue to get IV fluids. Do not drive or operate machinery until your health care provider says that it is safe. Summary Monitored anesthesia care is used to keep a patient comfortable during short procedures. Tell your health care provider about any allergies or health conditions you have and about all the medicines you are taking. Before the procedure, follow instructions about when to stop eating and drinking and about changing or stopping any medicines. Your blood pressure, heart rate, breathing rate, and blood oxygen level will be monitored until you leave the hospital or clinic. Plan to have a responsible adult take you home from the hospital or clinic. This information is not intended to replace advice given to you by your health care provider. Make sure you discuss any questions you have with your health care provider. Document Revised: 02/10/2021 Document Reviewed: 02/08/2019 Elsevier Patient Education  [place].

## 2021-08-18 ENCOUNTER — Other Ambulatory Visit: Payer: Self-pay

## 2021-08-18 ENCOUNTER — Encounter (HOSPITAL_COMMUNITY): Payer: Self-pay

## 2021-08-18 ENCOUNTER — Encounter (HOSPITAL_COMMUNITY)
Admission: RE | Admit: 2021-08-18 | Discharge: 2021-08-18 | Disposition: A | Discharge status: Home / Self Care | Payer: BC Managed Care – PPO | Source: Ambulatory Visit | Attending: Internal Medicine | Admitting: Internal Medicine

## 2021-08-18 VITALS — BP 126/67 | HR 68 | Temp 97.7°F | Resp 18 | Ht 72.0 in | Wt 233.0 lb

## 2021-08-18 DIAGNOSIS — Z01812 Encounter for preprocedural laboratory examination: Secondary | ICD-10-CM | POA: Insufficient documentation

## 2021-08-18 DIAGNOSIS — E119 Type 2 diabetes mellitus without complications: Secondary | ICD-10-CM | POA: Diagnosis not present

## 2021-08-18 DIAGNOSIS — K7469 Other cirrhosis of liver: Secondary | ICD-10-CM | POA: Insufficient documentation

## 2021-08-18 DIAGNOSIS — I10 Essential (primary) hypertension: Secondary | ICD-10-CM | POA: Diagnosis not present

## 2021-08-18 DIAGNOSIS — I8500 Esophageal varices without bleeding: Secondary | ICD-10-CM

## 2021-08-18 DIAGNOSIS — K7460 Unspecified cirrhosis of liver: Secondary | ICD-10-CM | POA: Insufficient documentation

## 2021-08-18 DIAGNOSIS — K3189 Other diseases of stomach and duodenum: Secondary | ICD-10-CM | POA: Diagnosis not present

## 2021-08-18 DIAGNOSIS — K2289 Other specified disease of esophagus: Secondary | ICD-10-CM | POA: Diagnosis not present

## 2021-08-18 DIAGNOSIS — K219 Gastro-esophageal reflux disease without esophagitis: Secondary | ICD-10-CM | POA: Diagnosis not present

## 2021-08-18 DIAGNOSIS — Z794 Long term (current) use of insulin: Secondary | ICD-10-CM | POA: Diagnosis not present

## 2021-08-18 DIAGNOSIS — I8510 Secondary esophageal varices without bleeding: Secondary | ICD-10-CM | POA: Diagnosis present

## 2021-08-18 DIAGNOSIS — G4730 Sleep apnea, unspecified: Secondary | ICD-10-CM | POA: Diagnosis not present

## 2021-08-18 DIAGNOSIS — K766 Portal hypertension: Secondary | ICD-10-CM | POA: Diagnosis not present

## 2021-08-18 DIAGNOSIS — K317 Polyp of stomach and duodenum: Secondary | ICD-10-CM | POA: Diagnosis not present

## 2021-08-18 DIAGNOSIS — K7581 Nonalcoholic steatohepatitis (NASH): Secondary | ICD-10-CM | POA: Diagnosis not present

## 2021-08-18 DIAGNOSIS — Z7984 Long term (current) use of oral hypoglycemic drugs: Secondary | ICD-10-CM | POA: Diagnosis not present

## 2021-08-18 DIAGNOSIS — K754 Autoimmune hepatitis: Secondary | ICD-10-CM | POA: Diagnosis not present

## 2021-08-18 LAB — COMPREHENSIVE METABOLIC PANEL
ALT: 28 U/L (ref 0–44)
AST: 38 U/L (ref 15–41)
Albumin: 3 g/dL — ABNORMAL LOW (ref 3.5–5.0)
Alkaline Phosphatase: 64 U/L (ref 38–126)
Anion gap: 6 (ref 5–15)
BUN: 12 mg/dL (ref 8–23)
CO2: 23 mmol/L (ref 22–32)
Calcium: 8.6 mg/dL — ABNORMAL LOW (ref 8.9–10.3)
Chloride: 109 mmol/L (ref 98–111)
Creatinine, Ser: 0.54 mg/dL — ABNORMAL LOW (ref 0.61–1.24)
GFR, Estimated: >60 mL/min (ref >60)
Glucose, Bld: 145 mg/dL — ABNORMAL HIGH (ref 70–99)
Potassium: 3.7 mmol/L (ref 3.5–5.1)
Sodium: 138 mmol/L (ref 135–145)
Total Bilirubin: 3.8 mg/dL — ABNORMAL HIGH (ref 0.3–1.2)
Total Protein: 6 g/dL — ABNORMAL LOW (ref 6.5–8.1)

## 2021-08-18 LAB — PROTIME-INR
INR: 1.3 — ABNORMAL HIGH (ref 0.8–1.2)
Prothrombin Time: 16.4 seconds — ABNORMAL HIGH (ref 11.4–15.2)

## 2021-08-18 LAB — CBC WITH DIFFERENTIAL/PLATELET
Abs Immature Granulocytes: 0.01 10*3/uL (ref 0.00–0.07)
Basophils Absolute: 0 10*3/uL (ref 0.0–0.1)
Basophils Relative: 1 %
Eosinophils Absolute: 0.2 10*3/uL (ref 0.0–0.5)
Eosinophils Relative: 6 %
HCT: 37.5 % — ABNORMAL LOW (ref 39.0–52.0)
Hemoglobin: 13 g/dL (ref 13.0–17.0)
Immature Granulocytes: 0 %
Lymphocytes Relative: 17 %
Lymphs Abs: 0.5 10*3/uL — ABNORMAL LOW (ref 0.7–4.0)
MCH: 36.6 pg — ABNORMAL HIGH (ref 26.0–34.0)
MCHC: 34.7 g/dL (ref 30.0–36.0)
MCV: 105.6 fL — ABNORMAL HIGH (ref 80.0–100.0)
Monocytes Absolute: 0.3 10*3/uL (ref 0.1–1.0)
Monocytes Relative: 12 %
Neutro Abs: 1.8 10*3/uL (ref 1.7–7.7)
Neutrophils Relative %: 64 %
Platelets: 42 10*3/uL — ABNORMAL LOW (ref 150–400)
RBC: 3.55 MIL/uL — ABNORMAL LOW (ref 4.22–5.81)
RDW: 12.6 % (ref 11.5–15.5)
WBC Morphology: REACTIVE
WBC: 2.8 10*3/uL — ABNORMAL LOW (ref 4.0–10.5)
nRBC: 0 % (ref 0.0–0.2)

## 2021-08-19 ENCOUNTER — Encounter (HOSPITAL_COMMUNITY)
Admission: RE | Disposition: A | Discharge status: Home / Self Care | Payer: Self-pay | Source: Ambulatory Visit | Attending: Internal Medicine

## 2021-08-19 ENCOUNTER — Ambulatory Visit (HOSPITAL_COMMUNITY): Payer: BC Managed Care – PPO | Admitting: Certified Registered"

## 2021-08-19 ENCOUNTER — Encounter (HOSPITAL_COMMUNITY): Payer: Self-pay | Admitting: Internal Medicine

## 2021-08-19 ENCOUNTER — Ambulatory Visit (HOSPITAL_COMMUNITY)
Admission: RE | Admit: 2021-08-19 | Discharge: 2021-08-19 | Disposition: A | Discharge status: Home / Self Care | Payer: BC Managed Care – PPO | Source: Ambulatory Visit | Attending: Internal Medicine | Admitting: Internal Medicine

## 2021-08-19 DIAGNOSIS — K7581 Nonalcoholic steatohepatitis (NASH): Secondary | ICD-10-CM | POA: Insufficient documentation

## 2021-08-19 DIAGNOSIS — E119 Type 2 diabetes mellitus without complications: Secondary | ICD-10-CM | POA: Insufficient documentation

## 2021-08-19 DIAGNOSIS — Z7984 Long term (current) use of oral hypoglycemic drugs: Secondary | ICD-10-CM | POA: Insufficient documentation

## 2021-08-19 DIAGNOSIS — K219 Gastro-esophageal reflux disease without esophagitis: Secondary | ICD-10-CM | POA: Insufficient documentation

## 2021-08-19 DIAGNOSIS — K2289 Other specified disease of esophagus: Secondary | ICD-10-CM | POA: Diagnosis not present

## 2021-08-19 DIAGNOSIS — G4730 Sleep apnea, unspecified: Secondary | ICD-10-CM | POA: Insufficient documentation

## 2021-08-19 DIAGNOSIS — I8500 Esophageal varices without bleeding: Secondary | ICD-10-CM | POA: Diagnosis not present

## 2021-08-19 DIAGNOSIS — K317 Polyp of stomach and duodenum: Secondary | ICD-10-CM | POA: Diagnosis not present

## 2021-08-19 DIAGNOSIS — K754 Autoimmune hepatitis: Secondary | ICD-10-CM | POA: Insufficient documentation

## 2021-08-19 DIAGNOSIS — Z794 Long term (current) use of insulin: Secondary | ICD-10-CM | POA: Insufficient documentation

## 2021-08-19 DIAGNOSIS — K7460 Unspecified cirrhosis of liver: Secondary | ICD-10-CM | POA: Insufficient documentation

## 2021-08-19 DIAGNOSIS — I8510 Secondary esophageal varices without bleeding: Secondary | ICD-10-CM | POA: Insufficient documentation

## 2021-08-19 DIAGNOSIS — K766 Portal hypertension: Secondary | ICD-10-CM | POA: Insufficient documentation

## 2021-08-19 DIAGNOSIS — K3189 Other diseases of stomach and duodenum: Secondary | ICD-10-CM | POA: Insufficient documentation

## 2021-08-19 DIAGNOSIS — I10 Essential (primary) hypertension: Secondary | ICD-10-CM | POA: Insufficient documentation

## 2021-08-19 HISTORY — PX: ESOPHAGOGASTRODUODENOSCOPY (EGD) WITH PROPOFOL: SHX5813

## 2021-08-19 LAB — GLUCOSE, CAPILLARY: Glucose-Capillary: 173 mg/dL — ABNORMAL HIGH (ref 70–99)

## 2021-08-19 SURGERY — ESOPHAGOGASTRODUODENOSCOPY (EGD) WITH PROPOFOL
Anesthesia: General

## 2021-08-19 MED ORDER — PROPOFOL 500 MG/50ML IV EMUL
INTRAVENOUS | Status: DC | PRN
  Administered 2021-08-19: 150 ug/kg/min via INTRAVENOUS

## 2021-08-19 MED ORDER — STERILE WATER FOR IRRIGATION IR SOLN
Status: DC | PRN
  Administered 2021-08-19: 120 mL

## 2021-08-19 MED ORDER — PHENYLEPHRINE 80 MCG/ML (10ML) SYRINGE FOR IV PUSH (FOR BLOOD PRESSURE SUPPORT)
PREFILLED_SYRINGE | INTRAVENOUS | Status: DC | PRN
  Administered 2021-08-19 (×2): 80 ug via INTRAVENOUS

## 2021-08-19 MED ORDER — LIDOCAINE HCL (CARDIAC) PF 100 MG/5ML IV SOSY
PREFILLED_SYRINGE | INTRAVENOUS | Status: DC | PRN
  Administered 2021-08-19: 50 mg via INTRAVENOUS

## 2021-08-19 MED ORDER — PROPOFOL 10 MG/ML IV BOLUS
INTRAVENOUS | Status: DC | PRN
  Administered 2021-08-19: 100 mg via INTRAVENOUS

## 2021-08-19 MED ORDER — PHENYLEPHRINE 80 MCG/ML (10ML) SYRINGE FOR IV PUSH (FOR BLOOD PRESSURE SUPPORT)
PREFILLED_SYRINGE | INTRAVENOUS | Status: AC
  Filled 2021-08-19: qty 10

## 2021-08-19 MED ORDER — LACTATED RINGERS IV SOLN
INTRAVENOUS | Status: DC
Start: 2021-08-19 — End: 2021-08-19
  Administered 2021-08-19: 1000 mL via INTRAVENOUS

## 2021-08-19 NOTE — Anesthesia Preprocedure Evaluation (Signed)
Anesthesia Evaluation  Patient identified by MRN, date of birth, ID band Patient awake    Reviewed: Allergy & Precautions, H&P , NPO status , Patient's Chart, lab work & pertinent test results, reviewed documented beta blocker date and time   Airway Mallampati: II  TM Distance: >3 FB Neck ROM: full    Dental no notable dental hx.    Pulmonary sleep apnea ,    Pulmonary exam normal breath sounds clear to auscultation       Cardiovascular Exercise Tolerance: Good hypertension,  Rhythm:regular Rate:Normal     Neuro/Psych negative neurological ROS  negative psych ROS   GI/Hepatic GERD  Medicated,(+) Hepatitis -, Autoimmune  Endo/Other  negative endocrine ROSdiabetes, Type 2  Renal/GU negative Renal ROS  negative genitourinary   Musculoskeletal   Abdominal   Peds  Hematology negative hematology ROS (+)   Anesthesia Other Findings   Reproductive/Obstetrics negative OB ROS                             Anesthesia Physical  Anesthesia Plan  ASA: 3  Anesthesia Plan: General   Post-op Pain Management:    Induction:   PONV Risk Score and Plan: Propofol infusion  Airway Management Planned:   Additional Equipment:   Intra-op Plan:   Post-operative Plan:   Informed Consent: I have reviewed the patients History and Physical, chart, labs and discussed the procedure including the risks, benefits and alternatives for the proposed anesthesia with the patient or authorized representative who has indicated his/her understanding and acceptance.     Dental Advisory Given  Plan Discussed with: CRNA  Anesthesia Plan Comments:         Anesthesia Quick Evaluation

## 2021-08-19 NOTE — Transfer of Care (Signed)
Immediate Anesthesia Transfer of Care Note  Patient: Shawn Ward  Procedure(s) Performed: ESOPHAGOGASTRODUODENOSCOPY (EGD) WITH PROPOFOL  Patient Location: Short Stay  Anesthesia Type:General  Level of Consciousness: drowsy  Airway & Oxygen Therapy: Patient Spontanous Breathing  Post-op Assessment: Report given to RN and Post -op Vital signs reviewed and stable  Post vital signs: Reviewed and stable  Last Vitals:  Vitals Value Taken Time  BP    Temp    Pulse    Resp    SpO2      Last Pain:  Vitals:   08/19/21 0730  TempSrc:   PainSc: 0-No pain      Patients Stated Pain Goal: 7 (26/37/85 8850)  Complications: No notable events documented.

## 2021-08-19 NOTE — Anesthesia Postprocedure Evaluation (Signed)
Anesthesia Post Note  Patient: Shawn Ward  Procedure(s) Performed: ESOPHAGOGASTRODUODENOSCOPY (EGD) WITH PROPOFOL  Patient location during evaluation: Phase II Anesthesia Type: General Level of consciousness: awake Pain management: pain level controlled Vital Signs Assessment: post-procedure vital signs reviewed and stable Respiratory status: spontaneous breathing and respiratory function stable Cardiovascular status: blood pressure returned to baseline and stable Postop Assessment: no headache and no apparent nausea or vomiting Anesthetic complications: no Comments: Late entry   No notable events documented.   Last Vitals:  Vitals:   08/19/21 0654 08/19/21 0746  BP: 127/77 (!) 110/58  Pulse: 68 69  Resp: 16 14  Temp: 36.4 C 36.6 C  SpO2: 97% 93%    Last Pain:  Vitals:   08/19/21 0746  TempSrc: Oral  PainSc: 0-No pain                 Louann Sjogren
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
## 2021-08-19 NOTE — Discharge Instructions (Signed)
Resume usual medications and diet as before. No driving for 24 hours. Repeat exam in 6 months.  Please note your MELD score from yesterday's blood work is 14

## 2021-08-19 NOTE — Anesthesia Procedure Notes (Signed)
Date/Time: 08/19/2021 7:38 AM Performed by: Orlie Dakin, CRNA Pre-anesthesia Checklist: Patient identified, Emergency Drugs available, Suction available and Patient being monitored Patient Re-evaluated:Patient Re-evaluated prior to induction Oxygen Delivery Method: Nasal cannula Induction Type: IV induction Placement Confirmation: positive ETCO2

## 2021-08-19 NOTE — Op Note (Signed)
[facility] Patient Name: Shawn Ward Procedure Date: 08/19/2021 7:09 AM MRN: [id number] Date of Birth: [birth year] Attending MD: Hildred Laser , MD CSN: [id number] Age: 61 Admit Type: Outpatient Procedure:                Upper GI endoscopy Indications:              Esophageal varices, Follow-up of esophageal                            varices, For therapy of esophageal varices Providers:                Hildred Laser, MD, Lurline Del, RN, Casimer Bilis, Technician Referring MD:             Ozzie Hoyle PA, [facility]. Medicines:                Propofol per Anesthesia Complications:            No immediate complications. Estimated Blood Loss:     Estimated blood loss: none. Procedure:                Pre-Anesthesia Assessment:                           - Prior to the procedure, a History and Physical                            was performed, and patient medications and                            allergies were reviewed. The patient's tolerance of                            previous anesthesia was also reviewed. The risks                            and benefits of the procedure and the sedation                            options and risks were discussed with the patient.                            All questions were answered, and informed consent                            was obtained. Prior Anticoagulants: The patient has                            taken no previous anticoagulant or antiplatelet                            agents. ASA Grade Assessment: III - A patient with  severe systemic disease. After reviewing the risks                            and benefits, the patient was deemed in                            satisfactory condition to undergo the procedure.                           After obtaining informed consent, the endoscope was                            passed under direct vision. Throughout the                             procedure, the patient's blood pressure, pulse, and                            oxygen saturations were monitored continuously. The                            [id number]-H190 ([id number]) scope was introduced through the                            mouth, and advanced to the second part of duodenum.                            The upper GI endoscopy was accomplished without                            difficulty. The patient tolerated the procedure                            well. Scope In: 7:35:47 AM Scope Out: 7:41:58 AM Total Procedure Duration: 0 hours 6 minutes 11 seconds  Findings:      The hypopharynx was normal.      The proximal esophagus was normal.      A post variceal banding scar was found in the mid esophagus and in the       distal esophagus. The scar tissue was healthy in appearance.      Grade I varices were found in the mid esophagus.      The Z-line was regular and was found 39 cm from the incisors.      Moderate portal hypertensive gastropathy was found in the gastric fundus       and in the gastric body.      Multiple 3 to 5 mm pedunculated and sessile polyps with no stigmata of       recent bleeding were found in the gastric fundus and in the gastric body.      The exam of the stomach was otherwise normal.      The duodenal bulb and second portion of the duodenum were normal. Impression:               - Normal hypopharynx.                           -  Normal proximal esophagus.                           - Scar in the mid esophagus and in the distal                            esophagus.                           - Grade I esophageal varices.                           - Z-line regular, 39 cm from the incisors.                           - Portal hypertensive gastropathy.                           - Multiple gastric polyps.                           - Normal duodenal bulb and second portion of the                            duodenum.                            - No specimens collected. Moderate Sedation:      Per Anesthesia Care Recommendation:           - Patient has a contact number available for                            emergencies. The signs and symptoms of potential                            delayed complications were discussed with the                            patient. Return to normal activities tomorrow.                            Written discharge instructions were provided to the                            patient.                           - Resume previous diet today.                           - Continue present medications.                           - Repeat upper endoscopy in 6 months. Procedure Code(s):        --- Professional ---  11155, Esophagogastroduodenoscopy, flexible,                            transoral; diagnostic, including collection of                            specimen(s) by brushing or washing, when performed                            (separate procedure) Diagnosis Code(s):        --- Professional ---                           K22.8, Other specified diseases of esophagus                           I85.00, Esophageal varices without bleeding                           K76.6, Portal hypertension                           K31.89, Other diseases of stomach and duodenum                           K31.7, Polyp of stomach and duodenum CPT copyright 2019 [employer]. All rights reserved. The codes documented in this report are preliminary and upon coder review may  be revised to meet current compliance requirements. Hildred Laser, MD Hildred Laser, MD 08/19/2021 7:50:45 AM This report has been signed electronically. Number of Addenda: 0

## 2021-08-26 ENCOUNTER — Encounter (HOSPITAL_COMMUNITY): Payer: Self-pay | Admitting: Internal Medicine

## 2022-01-12 ENCOUNTER — Encounter (INDEPENDENT_AMBULATORY_CARE_PROVIDER_SITE_OTHER): Payer: Self-pay | Admitting: *Deleted

## 2022-03-10 NOTE — Progress Notes (Signed)
This encounter was created in error - please disregard.
